# Patient Record
Sex: Male | Born: 1945
Health system: Southern US, Community
[De-identification: ages and names within clinical notes are randomized; demographics above are authoritative.]

## PROBLEM LIST (undated history)

## (undated) DIAGNOSIS — A048 Other specified bacterial intestinal infections: Secondary | ICD-10-CM

## (undated) DIAGNOSIS — I959 Hypotension, unspecified: Secondary | ICD-10-CM

## (undated) DIAGNOSIS — IMO0001 Reserved for inherently not codable concepts without codable children: Secondary | ICD-10-CM

## (undated) DIAGNOSIS — K219 Gastro-esophageal reflux disease without esophagitis: Secondary | ICD-10-CM

## (undated) HISTORY — PX: NO PAST SURGERIES: SHX2092

## (undated) HISTORY — DX: Other specified bacterial intestinal infections: A04.8

## (undated) HISTORY — DX: Gastro-esophageal reflux disease without esophagitis: K21.9

## (undated) HISTORY — DX: Hypotension, unspecified: I95.9

## (undated) HISTORY — DX: Reserved for inherently not codable concepts without codable children: IMO0001

---

## 2012-12-02 ENCOUNTER — Ambulatory Visit (INDEPENDENT_AMBULATORY_CARE_PROVIDER_SITE_OTHER): Payer: BC Managed Care – PPO | Admitting: Internal Medicine

## 2012-12-02 ENCOUNTER — Encounter: Payer: Self-pay | Admitting: Internal Medicine

## 2012-12-02 VITALS — BP 132/80 | HR 76 | Temp 97.8°F | Ht 67.0 in | Wt 136.0 lb

## 2012-12-02 DIAGNOSIS — B0229 Other postherpetic nervous system involvement: Secondary | ICD-10-CM

## 2012-12-02 DIAGNOSIS — Z0000 Encounter for general adult medical examination without abnormal findings: Secondary | ICD-10-CM

## 2012-12-02 LAB — POCT URINALYSIS DIPSTICK
Ketones, UA: NEGATIVE
Protein, UA: NEGATIVE
Spec Grav, UA: 1.015
pH, UA: 6

## 2012-12-02 LAB — COMPREHENSIVE METABOLIC PANEL
AST: 23 U/L (ref 0–37)
Alkaline Phosphatase: 53 U/L (ref 39–117)
BUN: 12 mg/dL (ref 6–23)
Calcium: 9 mg/dL (ref 8.4–10.5)
Chloride: 106 mEq/L (ref 96–112)
Creat: 0.84 mg/dL (ref 0.50–1.35)

## 2012-12-03 LAB — CBC WITH DIFFERENTIAL/PLATELET
Basophils Absolute: 0 10*3/uL (ref 0.0–0.1)
Basophils Relative: 0 % (ref 0–1)
Eosinophils Relative: 1 % (ref 0–5)
HCT: 38.7 % — ABNORMAL LOW (ref 39.0–52.0)
MCHC: 34.4 g/dL (ref 30.0–36.0)
MCV: 89.8 fL (ref 78.0–100.0)
Monocytes Absolute: 0.5 10*3/uL (ref 0.1–1.0)
RDW: 14.8 % (ref 11.5–15.5)

## 2012-12-03 LAB — HEPATITIS C ANTIBODY: HCV Ab: NEGATIVE

## 2012-12-03 LAB — HEPATITIS B SURFACE ANTIBODY, QUANTITATIVE: Hepatitis B-Post: 936.8 m[IU]/mL

## 2012-12-18 ENCOUNTER — Encounter: Payer: Self-pay | Admitting: Internal Medicine

## 2012-12-18 NOTE — Progress Notes (Signed)
  Subjective:    Patient ID: Preston Mcdaniel, male    DOB: 1946-04-09, 67 y.o.   MRN: [id number]  HPI Mr. Hoffer is a 67 year old native of [place] who was  Diagnosed 11/13/12 with acute Herpes zoster affecting his right trunk. He was placed on Valtrex one gram tid for 7 days, Sterapred   10 mg 6 day dose pack and Hydrocodone APAP. He has improved but having some post herpetic neuralgia type pain along right trunk keeping him from getting a good night's sleep.         Fhx: Mother died when pt was 39 years old during childbirth. Father died in 73 at age 39 reportedly of old age- pt cannot be more specific.  Social history: nonsmoker; does not drink alcohol. Budhist faith; married one daughter and one son. Daughter operates [employer] on [address]. Pt does not speak [place]. General health has been excellent with NKDA,  No serious illnesses, hospitalizations or accidents Works in [employer] in [place].     Review of Systems  Constitutional: Negative.   HENT: Negative.   Eyes: Negative.   Respiratory: Negative.   Cardiovascular: Negative.   Gastrointestinal: Negative.   Endocrine: Negative.   Genitourinary: Negative.   Musculoskeletal: Negative.   Allergic/Immunologic: Negative.   Neurological:       Right trunk pain S/p shingles  Hematological: Negative.   Psychiatric/Behavioral: Negative.        Objective:   Physical Exam  Vitals reviewed. Constitutional: He is oriented to person, place, and time. He appears well-developed and well-nourished. No distress.  HENT:  Head: Normocephalic and atraumatic.  Right Ear: External ear normal.  Left Ear: External ear normal.  Mouth/Throat: Oropharynx is clear and moist. No oropharyngeal exudate.  Eyes: Conjunctivae and EOM are normal. Pupils are equal, round, and reactive to light. Right eye exhibits no discharge. Left eye exhibits no discharge. No scleral icterus.  Neck: Neck supple. No JVD present. No thyromegaly  present.  Cardiovascular: Normal rate, regular rhythm, normal heart sounds and intact distal pulses.   No murmur heard. Pulmonary/Chest: Effort normal and breath sounds normal. No respiratory distress. He has no wheezes. He has no rales. He exhibits no tenderness.  Abdominal: Soft. Bowel sounds are normal. He exhibits no distension and no mass. There is no tenderness. There is no rebound and no guarding.  Genitourinary: Prostate normal.  Musculoskeletal: Normal range of motion. He exhibits no edema.  Lymphadenopathy:    He has no cervical adenopathy.  Neurological: He is alert and oriented to person, place, and time. He has normal reflexes. No cranial nerve deficit. Coordination normal.  Skin: Skin is warm and dry. He is not diaphoretic.  Resolving zoster rash right trunk  Psychiatric: He has a normal mood and affect. His behavior is normal. Judgment and thought content normal.          Assessment & Plan:  S/p Herpes zoster Jan 2014 Post Herpetic neuralgia Plan Gabapentin 300 mg bid or tid for pain- start with one dose daily and work up to tid. Call if symptoms persist.  Consider colonoscopy. Check for Hep B and Hep C. Need pneumovax at age 67

## 2012-12-18 NOTE — Patient Instructions (Addendum)
Take Gabapentin as directed for neuralgia. Call if symtoms persist.

## 2013-06-02 ENCOUNTER — Ambulatory Visit (INDEPENDENT_AMBULATORY_CARE_PROVIDER_SITE_OTHER): Payer: BC Managed Care – PPO | Admitting: Internal Medicine

## 2013-06-02 ENCOUNTER — Encounter: Payer: Self-pay | Admitting: Internal Medicine

## 2013-06-02 VITALS — BP 116/74 | HR 72 | Temp 97.8°F | Wt 135.0 lb

## 2013-06-02 DIAGNOSIS — Z1211 Encounter for screening for malignant neoplasm of colon: Secondary | ICD-10-CM

## 2013-06-02 DIAGNOSIS — I889 Nonspecific lymphadenitis, unspecified: Secondary | ICD-10-CM

## 2013-06-02 DIAGNOSIS — J069 Acute upper respiratory infection, unspecified: Secondary | ICD-10-CM

## 2013-06-02 DIAGNOSIS — Z8619 Personal history of other infectious and parasitic diseases: Secondary | ICD-10-CM

## 2013-06-02 NOTE — Progress Notes (Signed)
  Subjective:    Patient ID: Preston Mcdaniel, male    DOB: 27-Dec-1945, 67 y.o.   MRN: [id number]  HPI Patient in today for six-month recheck. In February, he had a severe case of shingles. He now wants to get Zostavax vaccine. He also wants to have a colonoscopy. Has never had screening colonoscopy. Son-in-law has had a recent respiratory infection. Patient has had some tenderness in his right neck area but denies sore throat or significant cough. No fever or shaking chills.    Review of Systems     Objective:   Physical Exam  Pharynx is clear without exudate. Dentition is fair. TMs are clear.neck is supple. Has small right anterior cervical node that is tender to palpation. Chest is clear to auscultation. Cardiac exam regular rate and rhythm normal S1 and S2.        Assessment & Plan:  Cervical adenitis  History of shingles  Plan: Prescription given to get Zostavax vaccine at pharmacy. Zithromax Z-PAK take 2 tablets day one followed by 1 tablet days 2 through 5 for cervical adenitis/URI. Return in 6 months for physical exam. Arrange for screening colonoscopy.

## 2013-06-02 NOTE — Patient Instructions (Addendum)
Prescription given for Zostavax vaccine to be obtained at pharmacy. Take Zithromax as directed for cervical adenitis and respiratory infection. Return in 6 months. We will arrange for colonoscopy

## 2013-06-05 NOTE — Addendum Note (Signed)
Addended by: Judy Pimple on: 06/05/2013 12:01 PM   Modules accepted: Orders

## 2013-06-05 NOTE — Addendum Note (Signed)
Addended by: Judy Pimple on: 06/05/2013 12:04 PM   Modules accepted: Orders

## 2013-07-24 ENCOUNTER — Encounter: Payer: Self-pay | Admitting: Gastroenterology

## 2013-12-08 ENCOUNTER — Other Ambulatory Visit: Payer: BC Managed Care – PPO | Admitting: Internal Medicine

## 2013-12-15 ENCOUNTER — Encounter: Payer: BC Managed Care – PPO | Admitting: Internal Medicine

## 2013-12-15 ENCOUNTER — Telehealth: Payer: Self-pay | Admitting: *Deleted

## 2013-12-15 NOTE — Telephone Encounter (Signed)
Attempted to call pt for Pre-Visit establish care call. Phone only rang, no voice mail set up.

## 2013-12-16 ENCOUNTER — Ambulatory Visit: Payer: BC Managed Care – PPO | Admitting: Family Medicine

## 2014-02-19 ENCOUNTER — Telehealth: Payer: Self-pay

## 2014-02-19 NOTE — Telephone Encounter (Signed)
Unable to reach patient pre visit. Invalid numbers listed.

## 2014-02-20 ENCOUNTER — Ambulatory Visit (INDEPENDENT_AMBULATORY_CARE_PROVIDER_SITE_OTHER): Payer: BC Managed Care – PPO | Admitting: Family Medicine

## 2014-02-20 ENCOUNTER — Encounter: Payer: Self-pay | Admitting: Family Medicine

## 2014-02-20 VITALS — BP 120/78 | HR 87 | Temp 98.6°F | Ht 67.0 in | Wt 137.0 lb

## 2014-02-20 DIAGNOSIS — H538 Other visual disturbances: Secondary | ICD-10-CM

## 2014-02-20 DIAGNOSIS — Z0000 Encounter for general adult medical examination without abnormal findings: Secondary | ICD-10-CM

## 2014-02-20 DIAGNOSIS — Z23 Encounter for immunization: Secondary | ICD-10-CM

## 2014-02-20 LAB — CBC WITH DIFFERENTIAL/PLATELET
BASOS ABS: 0 10*3/uL (ref 0.0–0.1)
Basophils Relative: 0.2 % (ref 0.0–3.0)
Eosinophils Absolute: 0 10*3/uL (ref 0.0–0.7)
Eosinophils Relative: 0.4 % (ref 0.0–5.0)
HEMATOCRIT: 46.5 % (ref 39.0–52.0)
Hemoglobin: 15.5 g/dL (ref 13.0–17.0)
LYMPHS ABS: 2.5 10*3/uL (ref 0.7–4.0)
Lymphocytes Relative: 28.4 % (ref 12.0–46.0)
MCHC: 33.4 g/dL (ref 30.0–36.0)
MCV: 93.4 fl (ref 78.0–100.0)
MONO ABS: 0.3 10*3/uL (ref 0.1–1.0)
Monocytes Relative: 3.6 % (ref 3.0–12.0)
NEUTROS PCT: 67.4 % (ref 43.0–77.0)
Neutro Abs: 5.9 10*3/uL (ref 1.4–7.7)
PLATELETS: 178 10*3/uL (ref 150.0–400.0)
RBC: 4.98 Mil/uL (ref 4.22–5.81)
RDW: 14.4 % (ref 11.5–14.6)
WBC: 8.7 10*3/uL (ref 4.5–10.5)

## 2014-02-20 LAB — POCT URINALYSIS DIPSTICK
Bilirubin, UA: NEGATIVE
Blood, UA: NEGATIVE
Glucose, UA: NEGATIVE
KETONES UA: NEGATIVE
Leukocytes, UA: NEGATIVE
Nitrite, UA: NEGATIVE
Protein, UA: NEGATIVE
Spec Grav, UA: 1.01
Urobilinogen, UA: 0.2
pH, UA: 7.5

## 2014-02-20 LAB — BASIC METABOLIC PANEL
BUN: 15 mg/dL (ref 6–23)
CHLORIDE: 102 meq/L (ref 96–112)
CO2: 26 mEq/L (ref 19–32)
CREATININE: 0.9 mg/dL (ref 0.4–1.5)
Calcium: 9.6 mg/dL (ref 8.4–10.5)
GFR: 94.76 mL/min (ref >60.00)
GLUCOSE: 98 mg/dL (ref 70–99)
POTASSIUM: 3.9 meq/L (ref 3.5–5.1)
Sodium: 138 mEq/L (ref 135–145)

## 2014-02-20 LAB — LIPID PANEL
CHOLESTEROL: 194 mg/dL (ref 0–200)
HDL: 53.8 mg/dL (ref >39.00)
LDL Cholesterol: 123 mg/dL — ABNORMAL HIGH (ref 0–99)
Total CHOL/HDL Ratio: 4
Triglycerides: 85 mg/dL (ref 0.0–149.0)
VLDL: 17 mg/dL (ref 0.0–40.0)

## 2014-02-20 LAB — HEPATIC FUNCTION PANEL
ALT: 21 U/L (ref 0–53)
AST: 22 U/L (ref 0–37)
Albumin: 4.6 g/dL (ref 3.5–5.2)
Alkaline Phosphatase: 61 U/L (ref 39–117)
BILIRUBIN DIRECT: 0.1 mg/dL (ref 0.0–0.3)
BILIRUBIN TOTAL: 1.1 mg/dL (ref 0.3–1.2)
Total Protein: 7.6 g/dL (ref 6.0–8.3)

## 2014-02-20 LAB — PSA: PSA: 0.64 ng/mL (ref 0.10–4.00)

## 2014-02-20 LAB — TSH: TSH: 1.89 u[IU]/mL (ref 0.35–5.50)

## 2014-02-20 NOTE — Progress Notes (Signed)
Pre visit review using our clinic review tool, if applicable. No additional management support is needed unless otherwise documented below in the visit note. 

## 2014-02-20 NOTE — Progress Notes (Signed)
   Subjective:    Patient ID: Preston Mcdaniel, male    DOB: 1946-06-14, 68 y.o.   MRN: [id number]  HPI Pt here for cpe and to establish.  Pt complains of blurry vision and floaters.  No other complaints.   History reviewed. No pertinent past medical history. History  Substance Use Topics  . Smoking status: Never Smoker   . Smokeless tobacco: Never Used  . Alcohol Use: No   History reviewed. No pertinent family history. No current outpatient prescriptions on file.   No current facility-administered medications for this visit.   No Known Allergies  Review of Systems  Constitutional: Negative.   HENT: Negative for congestion, ear pain, hearing loss, nosebleeds, postnasal drip, rhinorrhea, sinus pressure, sneezing and tinnitus.   Eyes: Positive for visual disturbance. Negative for photophobia, discharge and itching.       Blurry vision and sees floaters  Respiratory: Negative.   Cardiovascular: Negative.   Gastrointestinal: Negative for abdominal pain, constipation, blood in stool, abdominal distention and anal bleeding.  Endocrine: Negative.   Genitourinary: Negative.   Musculoskeletal: Negative.   Skin: Negative.   Allergic/Immunologic: Negative.   Neurological: Negative for dizziness, weakness, light-headedness, numbness and headaches.  Psychiatric/Behavioral: Negative for suicidal ideas, confusion, sleep disturbance, dysphoric mood, decreased concentration and agitation. The patient is not nervous/anxious.        Objective:   Physical Exam  Constitutional: He is oriented to person, place, and time. He appears well-developed and well-nourished. No distress.  HENT:  Head: Normocephalic and atraumatic.  Right Ear: External ear normal.  Left Ear: External ear normal.  Nose: Nose normal.  Mouth/Throat: Oropharynx is clear and moist. No oropharyngeal exudate.  Eyes: Conjunctivae and EOM are normal. Pupils are equal, round, and reactive to light. Right eye exhibits no discharge. Left  eye exhibits no discharge.  Neck: Normal range of motion. Neck supple. No JVD present. No thyromegaly present.  Cardiovascular: Normal rate, regular rhythm and intact distal pulses.  Exam reveals no gallop and no friction rub.   No murmur heard. Pulmonary/Chest: Effort normal and breath sounds normal. No respiratory distress. He has no wheezes. He has no rales. He exhibits no tenderness.  Abdominal: Soft. Bowel sounds are normal. He exhibits no distension and no mass. There is no tenderness. There is no rebound and no guarding.  Genitourinary: Rectum normal, prostate normal and penis normal. Guaiac negative stool.  Musculoskeletal: Normal range of motion. He exhibits no edema and no tenderness.  Lymphadenopathy:    He has no cervical adenopathy.  Neurological: He is alert and oriented to person, place, and time. He displays normal reflexes. He exhibits normal muscle tone.  Skin: Skin is warm and dry. No rash noted. He is not diaphoretic. No erythema. No pallor.  Psychiatric: He has a normal mood and affect. His behavior is normal. Judgment and thought content normal.          Assessment & Plan:  1. Blurry vision  - Ambulatory referral to Ophthalmology  2. Preventative health care See avs Check labs Colonoscopy refused Check ifob - Basic metabolic panel - CBC with Differential - Hepatic function panel - Lipid panel - POCT urinalysis dipstick - PSA - TSH - Varicella zoster antibody, IgG - Fecal occult blood, imunochemical; Future

## 2014-02-20 NOTE — Patient Instructions (Signed)
Preventive Care for Adults, Male A healthy lifestyle and preventive care can promote health and wellness. Preventive health guidelines for men include the following key practices:  A routine yearly physical is a good way to check with your health care provider about your health and preventative screening. It is a chance to share any concerns and updates on your health and to receive a thorough exam.  Visit your dentist for a routine exam and preventative care every 6 months. Brush your teeth twice a day and floss once a day. Good oral hygiene prevents tooth decay and gum disease.  The frequency of eye exams is based on your age, health, family medical history, use of contact lenses, and other factors. Follow your health care provider's recommendations for frequency of eye exams.  Eat a healthy diet. Foods such as vegetables, fruits, whole grains, low-fat dairy products, and lean protein foods contain the nutrients you need without too many calories. Decrease your intake of foods high in solid fats, added sugars, and salt. Eat the right amount of calories for you.Get information about a proper diet from your health care provider, if necessary.  Regular physical exercise is one of the most important things you can do for your health. Most adults should get at least 150 minutes of moderate-intensity exercise (any activity that increases your heart rate and causes you to sweat) each week. In addition, most adults need muscle-strengthening exercises on 2 or more days a week.  Maintain a healthy weight. The body mass index (BMI) is a screening tool to identify possible weight problems. It provides an estimate of body fat based on height and weight. Your health care provider can find your BMI and can help you achieve or maintain a healthy weight.For adults 20 years and older:  A BMI below 18.5 is considered underweight.  A BMI of 18.5 to 24.9 is normal.  A BMI of 25 to 29.9 is considered  overweight.  A BMI of 30 and above is considered obese.  Maintain normal blood lipids and cholesterol levels by exercising and minimizing your intake of saturated fat. Eat a balanced diet with plenty of fruit and vegetables. Blood tests for lipids and cholesterol should begin at age 42 and be repeated every 5 years. If your lipid or cholesterol levels are high, you are over 50, or you are at high risk for heart disease, you may need your cholesterol levels checked more frequently.Ongoing high lipid and cholesterol levels should be treated with medicines if diet and exercise are not working.  If you smoke, find out from your health care provider how to quit. If you do not use tobacco, do not start.  Lung cancer screening is recommended for adults aged 24 80 years who are at high risk for developing lung cancer because of a history of smoking. A yearly low-dose CT scan of the lungs is recommended for people who have at least a 30-pack-year history of smoking and are a current smoker or have quit within the past 15 years. A pack year of smoking is smoking an average of 1 pack of cigarettes a day for 1 year (for example: 1 pack a day for 30 years or 2 packs a day for 15 years). Yearly screening should continue until the smoker has stopped smoking for at least 15 years. Yearly screening should be stopped for people who develop a health problem that would prevent them from having lung cancer treatment.  If you choose to drink alcohol, do not have  more than 2 drinks per day. One drink is considered to be 12 ounces (355 mL) of beer, 5 ounces (148 mL) of wine, or 1.5 ounces (44 mL) of liquor.  Avoid use of street drugs. Do not share needles with anyone. Ask for help if you need support or instructions about stopping the use of drugs.  High blood pressure causes heart disease and increases the risk of stroke. Your blood pressure should be checked at least every 1 2 years. Ongoing high blood pressure should be  treated with medicines, if weight loss and exercise are not effective.  If you are 75 66 years old, ask your health care provider if you should take aspirin to prevent heart disease.  Diabetes screening involves taking a blood sample to check your fasting blood sugar level. This should be done once every 3 years, after age 19, if you are within normal weight and without risk factors for diabetes. Testing should be considered at a younger age or be carried out more frequently if you are overweight and have at least 1 risk factor for diabetes.  Colorectal cancer can be detected and often prevented. Most routine colorectal cancer screening begins at the age of 47 and continues through age 80. However, your health care provider may recommend screening at an earlier age if you have risk factors for colon cancer. On a yearly basis, your health care provider may provide home test kits to check for hidden blood in the stool. Use of a small camera at the end of a tube to directly examine the colon (sigmoidoscopy or colonoscopy) can detect the earliest forms of colorectal cancer. Talk to your health care provider about this at age 66, when routine screening begins. Direct exam of the colon should be repeated every 5 10 years through age 19, unless early forms of precancerous polyps or small growths are found.  People who are at an increased risk for hepatitis B should be screened for this virus. You are considered at high risk for hepatitis B if:  You were born in a country where hepatitis B occurs often. Talk with your health care provider about which countries are considered high-risk.  Your parents were born in a high-risk country and you have not received a shot to protect against hepatitis B (hepatitis B vaccine).  You have HIV or AIDS.  You use needles to inject street drugs.  You live with, or have sex with, someone who has hepatitis B.  You are a man who has sex with other men (MSM).  You get  hemodialysis treatment.  You take certain medicines for conditions such as cancer, organ transplantation, and autoimmune conditions.  Hepatitis C blood testing is recommended for all people born from [birth year] through 1965 and any individual with known risks for hepatitis C.  Practice safe sex. Use condoms and avoid high-risk sexual practices to reduce the spread of sexually transmitted infections (STIs). STIs include gonorrhea, chlamydia, syphilis, trichomonas, herpes, HPV, and human immunodeficiency virus (HIV). Herpes, HIV, and HPV are viral illnesses that have no cure. They can result in disability, cancer, and death.  A one-time screening for abdominal aortic aneurysm (AAA) and surgical repair of large AAAs by ultrasound are recommended for men ages 94 to 74 years who are current or former smokers.  Healthy men should no longer receive prostate-specific antigen (PSA) blood tests as part of routine cancer screening. Talk with your health care provider about prostate cancer screening.  Testicular cancer screening is not recommended  for adult males who have no symptoms. Screening includes self-exam, a health care provider exam, and other screening tests. Consult with your health care provider about any symptoms you have or any concerns you have about testicular cancer.  Use sunscreen. Apply sunscreen liberally and repeatedly throughout the day. You should seek shade when your shadow is shorter than you. Protect yourself by wearing long sleeves, pants, a wide-brimmed hat, and sunglasses year round, whenever you are outdoors.  Once a month, do a whole-body skin exam, using a mirror to look at the skin on your back. Tell your health care provider about new moles, moles that have irregular borders, moles that are larger than a pencil eraser, or moles that have changed in shape or color.  Stay current with required vaccines (immunizations).  Influenza vaccine. All adults should be immunized every  year.  Tetanus, diphtheria, and acellular pertussis (Td, Tdap) vaccine. An adult who has not previously received Tdap or who does not know his vaccine status should receive 1 dose of Tdap. This initial dose should be followed by tetanus and diphtheria toxoids (Td) booster doses every 10 years. Adults with an unknown or incomplete history of completing a 3-dose immunization series with Td-containing vaccines should begin or complete a primary immunization series including a Tdap dose. Adults should receive a Td booster every 10 years.  Varicella vaccine. An adult without evidence of immunity to varicella should receive 2 doses or a second dose if he has previously received 1 dose.  Human papillomavirus (HPV) vaccine. Males aged 44 21 years who have not received the vaccine previously should receive the 3-dose series. Males aged 43 26 years may be immunized. Immunization is recommended through the age of 50 years for any male who has sex with males and did not get any or all doses earlier. Immunization is recommended for any person with an immunocompromised condition through the age of 23 years if he did not get any or all doses earlier. During the 3-dose series, the second dose should be obtained 4 8 weeks after the first dose. The third dose should be obtained 24 weeks after the first dose and 16 weeks after the second dose.  Zoster vaccine. One dose is recommended for adults aged 96 years or older unless certain conditions are present.  Measles, mumps, and rubella (MMR) vaccine. Adults born before 55 generally are considered immune to measles and mumps. Adults born in [birth year] or later should have 1 or more doses of MMR vaccine unless there is a contraindication to the vaccine or there is laboratory evidence of immunity to each of the three diseases. A routine second dose of MMR vaccine should be obtained at least 28 days after the first dose for students attending postsecondary schools, health care  workers, or international travelers. People who received inactivated measles vaccine or an unknown type of measles vaccine during 1963 1967 should receive 2 doses of MMR vaccine. People who received inactivated mumps vaccine or an unknown type of mumps vaccine before 1979 and are at high risk for mumps infection should consider immunization with 2 doses of MMR vaccine. Unvaccinated health care workers born before [birth year] who lack laboratory evidence of measles, mumps, or rubella immunity or laboratory confirmation of disease should consider measles and mumps immunization with 2 doses of MMR vaccine or rubella immunization with 1 dose of MMR vaccine.  Pneumococcal 13-valent conjugate (PCV13) vaccine. When indicated, a person who is uncertain of his immunization history and has no record of immunization  should receive the PCV13 vaccine. An adult aged 67 years or older who has certain medical conditions and has not been previously immunized should receive 1 dose of PCV13 vaccine. This PCV13 should be followed with a dose of pneumococcal polysaccharide (PPSV23) vaccine. The PPSV23 vaccine dose should be obtained at least 8 weeks after the dose of PCV13 vaccine. An adult aged 79 years or older who has certain medical conditions and previously received 1 or more doses of PPSV23 vaccine should receive 1 dose of PCV13. The PCV13 vaccine dose should be obtained 1 or more years after the last PPSV23 vaccine dose.  Pneumococcal polysaccharide (PPSV23) vaccine. When PCV13 is also indicated, PCV13 should be obtained first. All adults aged 74 years and older should be immunized. An adult younger than age 50 years who has certain medical conditions should be immunized. Any person who resides in a nursing home or long-term care facility should be immunized. An adult smoker should be immunized. People with an immunocompromised condition and certain other conditions should receive both PCV13 and PPSV23 vaccines. People with human  immunodeficiency virus (HIV) infection should be immunized as soon as possible after diagnosis. Immunization during chemotherapy or radiation therapy should be avoided. Routine use of PPSV23 vaccine is not recommended for American Indians, [place] Natives, or people younger than 65 years unless there are medical conditions that require PPSV23 vaccine. When indicated, people who have unknown immunization and have no record of immunization should receive PPSV23 vaccine. One-time revaccination 5 years after the first dose of PPSV23 is recommended for people aged 41 64 years who have chronic kidney failure, nephrotic syndrome, asplenia, or immunocompromised conditions. People who received 1 2 doses of PPSV23 before age 15 years should receive another dose of PPSV23 vaccine at age 48 years or later if at least 5 years have passed since the previous dose. Doses of PPSV23 are not needed for people immunized with PPSV23 at or after age 69 years.  Meningococcal vaccine. Adults with asplenia or persistent complement component deficiencies should receive 2 doses of quadrivalent meningococcal conjugate (MenACWY-D) vaccine. The doses should be obtained at least 2 months apart. Microbiologists working with certain meningococcal bacteria, [place] recruits, people at risk during an outbreak, and people who travel to or live in countries with a high rate of meningitis should be immunized. A first-year college student up through age 7 years who is living in a residence hall should receive a dose if he did not receive a dose on or after his 16th birthday. Adults who have certain high-risk conditions should receive one or more doses of vaccine.  Hepatitis A vaccine. Adults who wish to be protected from this disease, have certain high-risk conditions, work with hepatitis A-infected animals, work in hepatitis A research labs, or travel to or work in countries with a high rate of hepatitis A should be immunized. Adults who were  previously unvaccinated and who anticipate close contact with an international adoptee during the first 60 days after arrival in the [place] from a country with a high rate of hepatitis A should be immunized.  Hepatitis B vaccine. Adults who wish to be protected from this disease, have certain high-risk conditions, may be exposed to blood or other infectious body fluids, are household contacts or sex partners of hepatitis B positive people, are clients or workers in certain care facilities, or travel to or work in countries with a high rate of hepatitis B should be immunized.  Haemophilus influenzae type b (Hib) vaccine. A  previously unvaccinated person with asplenia or sickle cell disease or having a scheduled splenectomy should receive 1 dose of Hib vaccine. Regardless of previous immunization, a recipient of a hematopoietic stem cell transplant should receive a 3-dose series 6 12 months after his successful transplant. Hib vaccine is not recommended for adults with HIV infection. Preventive Service / Frequency Ages 62 to 3  Blood pressure check.** / Every 1 to 2 years.  Lipid and cholesterol check.** / Every 5 years beginning at age 43.  Hepatitis C blood test.** / For any individual with known risks for hepatitis C.  Skin self-exam. / Monthly.  Influenza vaccine. / Every year.  Tetanus, diphtheria, and acellular pertussis (Tdap, Td) vaccine.** / Consult your health care provider. 1 dose of Td every 10 years.  Varicella vaccine.** / Consult your health care provider.  HPV vaccine. / 3 doses over 6 months, if 48 or younger.  Measles, mumps, rubella (MMR) vaccine.** / You need at least 1 dose of MMR if you were born in 1957 or later. You may also need a second dose.  Pneumococcal 13-valent conjugate (PCV13) vaccine.** / Consult your health care provider.  Pneumococcal polysaccharide (PPSV23) vaccine.** / 1 to 2 doses if you smoke cigarettes or if you have certain  conditions.  Meningococcal vaccine.** / 1 dose if you are age 8 to 70 years and a Market researcher living in a residence hall, or have one of several medical conditions. You may also need additional booster doses.  Hepatitis A vaccine.** / Consult your health care provider.  Hepatitis B vaccine.** / Consult your health care provider.  Haemophilus influenzae type b (Hib) vaccine.** / Consult your health care provider. Ages 48 to 32  Blood pressure check.** / Every 1 to 2 years.  Lipid and cholesterol check.** / Every 5 years beginning at age 38.  Lung cancer screening. / Every year if you are aged 40 80 years and have a 30-pack-year history of smoking and currently smoke or have quit within the past 15 years. Yearly screening is stopped once you have quit smoking for at least 15 years or develop a health problem that would prevent you from having lung cancer treatment.  Fecal occult blood test (FOBT) of stool. / Every year beginning at age 4 and continuing until age 70. You may not have to do this test if you get a colonoscopy every 10 years.  Flexible sigmoidoscopy** or colonoscopy.** / Every 5 years for a flexible sigmoidoscopy or every 10 years for a colonoscopy beginning at age 76 and continuing until age 62.  Hepatitis C blood test.** / For all people born from [birth year] through 1965 and any individual with known risks for hepatitis C.  Skin self-exam. / Monthly.  Influenza vaccine. / Every year.  Tetanus, diphtheria, and acellular pertussis (Tdap/Td) vaccine.** / Consult your health care provider. 1 dose of Td every 10 years.  Varicella vaccine.** / Consult your health care provider.  Zoster vaccine.** / 1 dose for adults aged 60 years or older.  Measles, mumps, rubella (MMR) vaccine.** / You need at least 1 dose of MMR if you were born in 1957 or later. You may also need a second dose.  Pneumococcal 13-valent conjugate (PCV13) vaccine.** / Consult your health care  provider.  Pneumococcal polysaccharide (PPSV23) vaccine.** / 1 to 2 doses if you smoke cigarettes or if you have certain conditions.  Meningococcal vaccine.** / Consult your health care provider.  Hepatitis A vaccine.** / Consult your health care  provider.  Hepatitis B vaccine.** / Consult your health care provider.  Haemophilus influenzae type b (Hib) vaccine.** / Consult your health care provider. Ages 65 and over  Blood pressure check.** / Every 1 to 2 years.  Lipid and cholesterol check.**/ Every 5 years beginning at age 20.  Lung cancer screening. / Every year if you are aged 55 80 years and have a 30-pack-year history of smoking and currently smoke or have quit within the past 15 years. Yearly screening is stopped once you have quit smoking for at least 15 years or develop a health problem that would prevent you from having lung cancer treatment.  Fecal occult blood test (FOBT) of stool. / Every year beginning at age 50 and continuing until age 75. You may not have to do this test if you get a colonoscopy every 10 years.  Flexible sigmoidoscopy** or colonoscopy.** / Every 5 years for a flexible sigmoidoscopy or every 10 years for a colonoscopy beginning at age 50 and continuing until age 75.  Hepatitis C blood test.** / For all people born from 1945 through 1965 and any individual with known risks for hepatitis C.  Abdominal aortic aneurysm (AAA) screening.** / A one-time screening for ages 65 to 75 years who are current or former smokers.  Skin self-exam. / Monthly.  Influenza vaccine. / Every year.  Tetanus, diphtheria, and acellular pertussis (Tdap/Td) vaccine.** / 1 dose of Td every 10 years.  Varicella vaccine.** / Consult your health care provider.  Zoster vaccine.** / 1 dose for adults aged 60 years or older.  Pneumococcal 13-valent conjugate (PCV13) vaccine.** / Consult your health care provider.  Pneumococcal polysaccharide (PPSV23) vaccine.** / 1 dose for all  adults aged 65 years and older.  Meningococcal vaccine.** / Consult your health care provider.  Hepatitis A vaccine.** / Consult your health care provider.  Hepatitis B vaccine.** / Consult your health care provider.  Haemophilus influenzae type b (Hib) vaccine.** / Consult your health care provider. **Family history and personal history of risk and conditions may change your health care provider's recommendations. Document Released: 12/12/2001 Document Revised: 08/06/2013 Document Reviewed: 03/13/2011 [employer] Patient Information 2014 [employer].  

## 2014-02-21 LAB — VARICELLA ZOSTER ANTIBODY, IGG

## 2014-02-25 ENCOUNTER — Other Ambulatory Visit (INDEPENDENT_AMBULATORY_CARE_PROVIDER_SITE_OTHER): Payer: BC Managed Care – PPO

## 2014-02-25 DIAGNOSIS — Z0000 Encounter for general adult medical examination without abnormal findings: Secondary | ICD-10-CM

## 2014-02-25 LAB — FECAL OCCULT BLOOD, IMMUNOCHEMICAL: Fecal Occult Bld: NEGATIVE

## 2014-03-20 ENCOUNTER — Ambulatory Visit (INDEPENDENT_AMBULATORY_CARE_PROVIDER_SITE_OTHER): Payer: BC Managed Care – PPO | Admitting: *Deleted

## 2014-03-20 DIAGNOSIS — Z23 Encounter for immunization: Secondary | ICD-10-CM

## 2014-06-12 ENCOUNTER — Encounter: Payer: Self-pay | Admitting: Family Medicine

## 2014-06-12 ENCOUNTER — Ambulatory Visit (INDEPENDENT_AMBULATORY_CARE_PROVIDER_SITE_OTHER): Payer: BC Managed Care – PPO | Admitting: Family Medicine

## 2014-06-12 VITALS — BP 150/90 | HR 81 | Temp 97.4°F | Wt 138.4 lb

## 2014-06-12 DIAGNOSIS — T148XXA Other injury of unspecified body region, initial encounter: Secondary | ICD-10-CM

## 2014-06-12 DIAGNOSIS — I10 Essential (primary) hypertension: Secondary | ICD-10-CM

## 2014-06-12 DIAGNOSIS — R55 Syncope and collapse: Secondary | ICD-10-CM

## 2014-06-12 DIAGNOSIS — I4510 Unspecified right bundle-branch block: Secondary | ICD-10-CM

## 2014-06-12 DIAGNOSIS — R0602 Shortness of breath: Secondary | ICD-10-CM

## 2014-06-12 LAB — POCT URINALYSIS DIPSTICK
Bilirubin, UA: NEGATIVE
Blood, UA: NEGATIVE
Glucose, UA: NEGATIVE
Ketones, UA: NEGATIVE
LEUKOCYTES UA: NEGATIVE
NITRITE UA: NEGATIVE
PH UA: 8
Protein, UA: NEGATIVE
Spec Grav, UA: <=1.005
UROBILINOGEN UA: 0.2

## 2014-06-12 LAB — BASIC METABOLIC PANEL
BUN: 11 mg/dL (ref 6–23)
CHLORIDE: 101 meq/L (ref 96–112)
CO2: 29 meq/L (ref 19–32)
Calcium: 9.8 mg/dL (ref 8.4–10.5)
Creatinine, Ser: 0.9 mg/dL (ref 0.4–1.5)
GFR: 85.43 mL/min (ref >60.00)
GLUCOSE: 101 mg/dL — AB (ref 70–99)
POTASSIUM: 3.6 meq/L (ref 3.5–5.1)
Sodium: 135 mEq/L (ref 135–145)

## 2014-06-12 LAB — CBC WITH DIFFERENTIAL/PLATELET
BASOS PCT: 0.1 % (ref 0.0–3.0)
Basophils Absolute: 0 10*3/uL (ref 0.0–0.1)
EOS PCT: 0.4 % (ref 0.0–5.0)
Eosinophils Absolute: 0 10*3/uL (ref 0.0–0.7)
HCT: 45.1 % (ref 39.0–52.0)
Hemoglobin: 15.3 g/dL (ref 13.0–17.0)
Lymphocytes Relative: 32.1 % (ref 12.0–46.0)
Lymphs Abs: 2.5 10*3/uL (ref 0.7–4.0)
MCHC: 33.8 g/dL (ref 30.0–36.0)
MCV: 93 fl (ref 78.0–100.0)
MONOS PCT: 4.6 % (ref 3.0–12.0)
Monocytes Absolute: 0.4 10*3/uL (ref 0.1–1.0)
NEUTROS PCT: 62.8 % (ref 43.0–77.0)
Neutro Abs: 5 10*3/uL (ref 1.4–7.7)
Platelets: 174 10*3/uL (ref 150.0–400.0)
RBC: 4.85 Mil/uL (ref 4.22–5.81)
RDW: 14.1 % (ref 11.5–15.5)
WBC: 7.9 10*3/uL (ref 4.0–10.5)

## 2014-06-12 LAB — HEPATIC FUNCTION PANEL
ALBUMIN: 4.4 g/dL (ref 3.5–5.2)
ALT: 19 U/L (ref 0–53)
AST: 24 U/L (ref 0–37)
Alkaline Phosphatase: 60 U/L (ref 39–117)
Bilirubin, Direct: 0 mg/dL (ref 0.0–0.3)
TOTAL PROTEIN: 7.7 g/dL (ref 6.0–8.3)
Total Bilirubin: 0.5 mg/dL (ref 0.2–1.2)

## 2014-06-12 LAB — APTT: aPTT: 32.1 s (ref 23.4–32.7)

## 2014-06-12 LAB — PROTIME-INR
INR: 1 ratio (ref 0.8–1.0)
Prothrombin Time: 10.7 s (ref 9.6–13.1)

## 2014-06-12 MED ORDER — LISINOPRIL 10 MG PO TABS: 10.0000 mg | ORAL_TABLET | Freq: Every day | ORAL | Status: DC

## 2014-06-12 NOTE — Patient Instructions (Signed)
Ng?t (Syncope) Ng?t l m?t thu?t ng? y h?c ?? ch? ng?t ho?c b?t t?nh. ?i?u ny c ngh?a l qu v? m?t  th?c v ng xu?ng ??t. Ng??i ta th??ng b?t t?nh trong th?i gian d??i 5 pht. Qu v? c th? c m?t s? l?n co gi?t c? ln ??n 15 giy tr??c khi t?nh v tr? l?i bnh th??ng. Ng?t x?y ra th??ng xuyn h?n ? nh?ng ng??i cao tu?i, nh?ng n c th? x?y ra v?i b?t c? ai. Trong khi h?u h?t nguyn nhn ng?t l khng nguy hi?m, ng?t c th? l d?u hi?u c?a m?t v?n ?? s?c kh?e nghim tr?ng. ?i?u quan tr?ng l c?n ???c ch?m [place] y t?.  NGUYN NHN  Ng?t l do dng mu ln no b? gi?m ??t ng?t. Nguyn nhn c? th? th??ng khng ???c xc ??nh. Cc y?u t? c th? gy ra ng?t bao g?m:  Dng thu?c h? huy?t p.  Thay ??i t? th? ??t ng?t, ch?ng h?n nh? ??ng ln nhanh.  Dng nhi?u thu?c h?n k ??n.  ??ng ? m?t n?i qu lu.  B?nh ??ng kinh.  M?t n??c v ti?p xc qu nhi?u v?i nhi?t.  L??ng ???ng trong mu th?p (h? ???ng huy?t).  Ph?i r?n m?nh ?? ??i ti?n.  B?nh tim, nh?p tim khng ??u ho?c cc v?n ?? tu?n hon khc.  S? hi, ?au kh? v? tnh th?n, nhn th?y mu ho?c ?au r?t nhi?u. TRI?U CH?NG  Ngay tr??c khi ng?t, qu v? c th?:  C?m th?y chng m?t ho?c chong vng.  C?m th?y bu?n nn.  Nhn th?y ton mu tr?ng ho?c ton mu ?en trong t?m nhn c?a qu v?.  Da l?nh, ?m. CH?N ?ON  Chuyn gia ch?m [place] s?c kh?e s? h?i v? cc tri?u ch?ng, khm th?c th?, v lm ?i?n tm ?? (ECG) ?? ghi l?i ho?t ??ng ?i?n c?a tim qu v?. Chuyn gia ch?m [place] s?c kh?e c?ng c th? khm tim ho?c lm cc xt nghi?m mu khc ?? xc ??nh nguyn nhn gy ng?t, c th? bao g?m:  Siu m tim qua thnh ng?c (TTE). Trong siu m tim, sng m thanh ???c s? d?ng ?? ?nh gi mu l?u thng qua tim nh? th? no.  Siu m tim qua th?c qu?n (TEE).  Theo di tim. Bi?n php ny cho php chuyn gia ch?m [place] s?c kh?e theo di nh?p v nh?p ?i?u c?a tim theo th?i gian th?c.  My ?i?n tim. ?y l m?t thi?t b? c?m tay ghi l?i nh?p tim v c th? gip  ch?n ?on r?i lo?n nh?p tim. N cho php chuyn gia ch?m [place] s?c kh?e theo di ho?t ??ng c?a tim trong vi ngy n?u c?n.  Nghi?m php g?ng s?c b?ng cch t?p th? d?c ho?c b?ng cch s? d?ng thu?c lm cho tim ??p nhanh h?n. ?I?U TR?  H?u h?t cc tr??ng h?p khng c?n ?i?u tr?Marland Kitchen Ty thu?c vo nguyn nhn gy ng?t, chuyn gia ch?m [place] s?c kh?e c th? [place] qu v? thay ??i ho?c d?ng m?t s? cc lo?i thu?c c?a qu v?. H??NG D?N CH?M [place] T?I NH  Nh? ai ? ? l?i cng qu v? cho ??n khi qu v? c?m th?y ?n ??nh.  Khng li xe, s? d?ng my mc, ho?c ch?i th? thao cho ??n khi chuyn gia ch?m [place] s?c kh?e ni c th? th?c hi?n ???c ?i?u ?.  Tun th? m?i cu?c h?n khm l?i theo ch? d?n c?a chuyn gia ch?m [place] s?c kh?e.  N?m xu?ng  ngay l?p t?c n?u qu v? b?t ??u c?m th?y nh? qu v? c th? ng?t. Ht th? su v ??u. Ch? cho ??n khi t?t c? cc tri?u ch?ng qua ?i.  U?ng ?? n??c ?? gi? cho n??c ti?u trong ho?c vng nh?t.  N?u qu v? ?ang dng thu?c ?i?u tr? huy?t p ho?c thu?c ?i?u tr? b?nh tim, hy ??ng d?y t? t? v dnh vi pht ?? ng?i r?i sau ? ??ng ln. ?i?u ny c th? gi?m c?m gic chng m?t. NGAY L?P T?C ?I KHM N?U:   Qu v? b? ?au ??u nhi?u.  Qu v? b? ?au b?t th??ng ? ng?c, b?ng ho?c l?ng.  Qu v? b? ch?y mu t? mi?ng ho?c tr?c trng, ho?c ?i ngoi ra phn c mu ?en ho?c mu h?c n.  Qu v? c nh?p tim khng ??u ho?c r?t nhanh.  Qu v? b? ?au khi th?.  Qu v? b? ng?t l?p ?i l?p l?i ho?c co gi?t gi?ng nh? ??ng kinh trong lc ng?t.  Qu v? ng?t khi ng?i ho?c n?m xu?ng.  Qu v? b? l l?n.  Qu v? ?i b? kh kh?n.  Qu v? b? y?u nhi?u.  Qu v? c v?n ?? v? th? l?c. N?u qu v? ng?t, hy g?i cc d?ch v? c?p c?u ? ??a ph??ng c?a qu v? (911 ? [place] Khng t? li xe ??n b?nh vi?n.  ??M B?O QU V?:  Hi?u r cc h??ng d?n ny.  S? theo di tnh tr?ng c?a mnh.  S? yu c?u tr? gip ngay l?p t?c n?u qu v? c?m th?y khng kh?e ho?c th?y tr?m tr?ng h?n. Document Released: 04/16/2012 Document  Revised: 10/21/2013 [facility] LLCExit[facility] Patient Information 2015 [place] LakeExitCare, [place]LL[place]. This information is not intended to replace advice given to you by your health care provider. Make sure you discuss any questions you have with your health care provider.

## 2014-06-12 NOTE — Progress Notes (Signed)
Pre visit review using our clinic review tool, if applicable. No additional management support is needed unless otherwise documented below in the visit note. 

## 2014-06-12 NOTE — Progress Notes (Signed)
   Subjective:    Patient ID: Preston Mcdaniel, male    DOB: 1945/12/15, 68 y.o.   MRN: [id number]  HPI Pt here c/o near syncope on 8/3--- he did not completely black out but had sob with episode and could not move at the time.  With interpreter pt states co workers massaged arms and legs and he was able to get up.  It has not occurred since but bp has been high.  No chest pain.    Review of Systems As above    Past Medical History  Diagnosis Date  . Hypertension    History   Social History  . Marital Status: Married    Spouse Name: N/A    Number of Children: N/A  . Years of Education: N/A   Occupational History  . Not on file.   Social History Main Topics  . Smoking status: Never Smoker   . Smokeless tobacco: Never Used  . Alcohol Use: No  . Drug Use: No  . Sexual Activity: Not on file   Other Topics Concern  . Not on file   Social History Narrative  . No narrative on file   History reviewed. No pertinent family history. Current Outpatient Prescriptions  Medication Sig Dispense Refill  . lisinopril (PRINIVIL,ZESTRIL) 10 MG tablet Take 1 tablet (10 mg total) by mouth daily.  30 tablet  2   No current facility-administered medications for this visit.    Objective:   Physical Exam BP 150/90  Pulse 81  Temp(Src) 97.4 F (36.3 C) (Oral)  Wt 138 lb 6.4 oz (62.778 kg)  SpO2 96% General appearance: alert, cooperative, appears stated age and no distress Throat: lips, mucosa, and tongue normal; teeth and gums normal Neck: no adenopathy, no JVD, supple, symmetrical, trachea midline and thyroid not enlarged, symmetric, no tenderness/mass/nodules Lungs: clear to auscultation bilaterally Heart: S1, S2 normal Extremities: extremities normal, atraumatic, no cyanosis or edema       Assessment & Plan:  1. SOB (shortness of breath)with episode of syncope - EKG 12-Lead - 2D Echocardiogram without contrast; Future - CBC with Differential - Basic metabolic panel - Hepatic  function panel - POCT urinalysis dipstick - INR/PT - PTT  2. Syncope, unspecified syncope type Suspect arrythmia - US Carotid Duplex Bilateral; Future - 2D Echocardiogram without contrast; Future - CBC with Differential - Basic metabolic panel - Hepatic function panel - POCT urinalysis dipstick - INR/PT - PTT  3. Bruising Check labs - CBC with Differential - Basic metabolic panel - Hepatic function panel - POCT urinalysis dipstick - INR/PT - PTT  4. Essential hypertension Start meds rto 2 weeks - lisinopril (PRINIVIL,ZESTRIL) 10 MG tablet; Take 1 tablet (10 mg total) by mouth daily.  Dispense: 30 tablet; Refill: 2

## 2014-06-17 ENCOUNTER — Ambulatory Visit (HOSPITAL_BASED_OUTPATIENT_CLINIC_OR_DEPARTMENT_OTHER)
Admission: RE | Admit: 2014-06-17 | Discharge: 2014-06-17 | Disposition: A | Discharge status: Home / Self Care | Payer: BC Managed Care – PPO | Source: Ambulatory Visit | Attending: Family Medicine | Admitting: Family Medicine

## 2014-06-17 DIAGNOSIS — R55 Syncope and collapse: Secondary | ICD-10-CM

## 2014-06-17 DIAGNOSIS — I359 Nonrheumatic aortic valve disorder, unspecified: Secondary | ICD-10-CM

## 2014-06-17 DIAGNOSIS — R0602 Shortness of breath: Secondary | ICD-10-CM

## 2014-06-17 NOTE — Progress Notes (Signed)
  Echocardiogram 2D Echocardiogram has been performed.  Preston Mcdaniel 06/17/2014, 11:40 AM

## 2014-07-08 ENCOUNTER — Other Ambulatory Visit: Payer: Self-pay

## 2014-07-08 DIAGNOSIS — Z87898 Personal history of other specified conditions: Secondary | ICD-10-CM

## 2014-07-08 DIAGNOSIS — I5189 Other ill-defined heart diseases: Secondary | ICD-10-CM

## 2014-08-05 ENCOUNTER — Telehealth: Payer: Self-pay | Admitting: Cardiology

## 2014-08-05 ENCOUNTER — Encounter: Payer: Self-pay | Admitting: Cardiology

## 2014-08-05 ENCOUNTER — Ambulatory Visit (INDEPENDENT_AMBULATORY_CARE_PROVIDER_SITE_OTHER): Payer: BC Managed Care – PPO | Admitting: Cardiology

## 2014-08-05 VITALS — BP 146/72 | HR 85 | Ht 67.0 in | Wt 134.8 lb

## 2014-08-05 DIAGNOSIS — I10 Essential (primary) hypertension: Secondary | ICD-10-CM

## 2014-08-05 DIAGNOSIS — R55 Syncope and collapse: Secondary | ICD-10-CM

## 2014-08-05 NOTE — Progress Notes (Signed)
     HPI: 68 year old male for evaluation of near syncope. Echocardiogram August 2015 showed normal LV function, grade 1 diastolic dysfunction, mild aortic and mitral regurgitation. Carotid Dopplers in August 2015 showed less than 50% left internal carotid artery stenosis. Laboratories August 2015 showed normal hemoglobin, potassium and liver functions. Note patient is from [place] and does not speak [place]. History is obtained with the assistance of an interpreter. Patient typically does not have dyspnea on exertion, orthopnea, PND, pedal edema, palpitations or chest pain. On August 1 he was going to work and developed "weakness". He was sent home. On arrival he had numbness in his arms and legs and his family he was massaging his extremities. They gave him ginger water to drink. He then developed nausea with vomiting and diaphoresis. There was associated palpitations. No chest pain or dyspnea. His symptoms then improved. He has felt as if his health is not quite as good since then. No definitive complaints.  No current outpatient prescriptions on file.   No current facility-administered medications for this visit.    No Known Allergies  Past Medical History  Diagnosis Date  . No cardiac disease     Past Surgical History  Procedure Laterality Date  . No past surgeries      History   Social History  . Marital Status: Married    Spouse Name: N/A    Number of Children: 2  . Years of Education: N/A   Occupational History  .      Restaurant   Social History Main Topics  . Smoking status: Never Smoker   . Smokeless tobacco: Never Used  . Alcohol Use: No  . Drug Use: No  . Sexual Activity: Not on file   Other Topics Concern  . Not on file   Social History Narrative  . No narrative on file    History reviewed. No pertinent family history.  ROS: no fevers or chills, productive cough, hemoptysis, dysphasia, odynophagia, melena, hematochezia, dysuria, hematuria, rash, seizure  activity, orthopnea, PND, pedal edema, claudication. Remaining systems are negative.  Physical Exam:   Blood pressure 146/72, pulse 85, height 5\' 7"  (1.702 m), weight 134 lb 12.8 oz (61.145 kg), SpO2 99.00%.  General:  Well developed/well nourished in NAD Skin warm/dry Patient not depressed No peripheral clubbing Back-normal HEENT-normal/normal eyelids Neck supple/normal carotid upstroke bilaterally; no bruits; no JVD; no thyromegaly chest - CTA/ normal expansion CV - RRR/normal S1 and S2; no murmurs, rubs or gallops;  PMI nondisplaced Abdomen -NT/ND, no HSM, no mass, + bowel sounds, no bruit 2+ femoral pulses, no bruits Ext-no edema, chords, 2+ DP Neuro-grossly nonfocal  ECG 06/12/2014-sinus rhythm, right bundle branch block.

## 2014-08-05 NOTE — Telephone Encounter (Signed)
Calling because he has decided to do the procedure that was discussed with Dr.Crenshaw .Marland Kitchen. Please call .Marland Kitchen.may need a translator  Thanks

## 2014-08-05 NOTE — Patient Instructions (Signed)
Your physician wants you to follow-up in: 6 MOINTHS WITH DR Jens SomRENSHAW You will receive a reminder letter in the mail two months in advance. If you don't receive a letter, please call our office to schedule the follow-up appointment.

## 2014-08-05 NOTE — Assessment & Plan Note (Signed)
Etiology of events unclear. Language barrier makes history difficult. He did not lose frank consciousness. His LV function is normal. His electrocardiogram shows sinus rhythm with right bundle branch block. I am not convinced it is consistent with Brugada. I have scheduled him for a monitor to look for significant arrhythmias.

## 2014-08-05 NOTE — Assessment & Plan Note (Signed)
Blood pressure mildly elevated. Further management per primary care. He may require antihypertensive in the future.

## 2014-08-06 NOTE — Telephone Encounter (Signed)
Order for event monitor placed. Please call to schedule

## 2014-08-10 ENCOUNTER — Telehealth: Payer: Self-pay

## 2014-08-10 NOTE — Telephone Encounter (Signed)
Needs appt in 2-3 months for BP check, mildly elevated at her cardiologists recently  ----- Message -----   From: Lewayne BuntingBrian S Crenshaw, MD  Sent: 08/05/2014 11:35 AM  To: Lelon PerlaYvonne R Lowne, DO   Pong [place] interpreter called the patient in a conference call and  Left a detailed message to have the patient come into the office to have a BP check with Dr.Lowne.     KP

## 2014-08-11 NOTE — Telephone Encounter (Signed)
We have made contact with the family and they are going to call to schedule.

## 2014-08-24 ENCOUNTER — Encounter (INDEPENDENT_AMBULATORY_CARE_PROVIDER_SITE_OTHER): Payer: BC Managed Care – PPO

## 2014-08-24 ENCOUNTER — Encounter: Payer: Self-pay | Admitting: *Deleted

## 2014-08-24 DIAGNOSIS — R55 Syncope and collapse: Secondary | ICD-10-CM

## 2014-08-24 NOTE — Progress Notes (Signed)
Patient ID: Preston AsaHa Mcdaniel, male   DOB: 1946-07-19, 68 y.o.   MRN: [id number] Preventice verite 30 day cardiac event monitor applied to patient.

## 2014-09-30 ENCOUNTER — Encounter: Payer: Self-pay | Admitting: *Deleted

## 2014-12-03 IMAGING — US US CAROTID DUPLEX BILAT
1 series · 13 of 24 positions shown · non-contrast
Comparison: None.

CLINICAL DATA: Syncopal episode.

EXAM:
BILATERAL CAROTID DUPLEX ULTRASOUND
TECHNIQUE: Gray scale imaging, color Doppler and duplex ultrasound were
performed of bilateral carotid and vertebral arteries in the neck.

[Series 1: us carotid duplex bilat · 0.07mm/px · 13 of 72 slices shown]
[im 1/72]
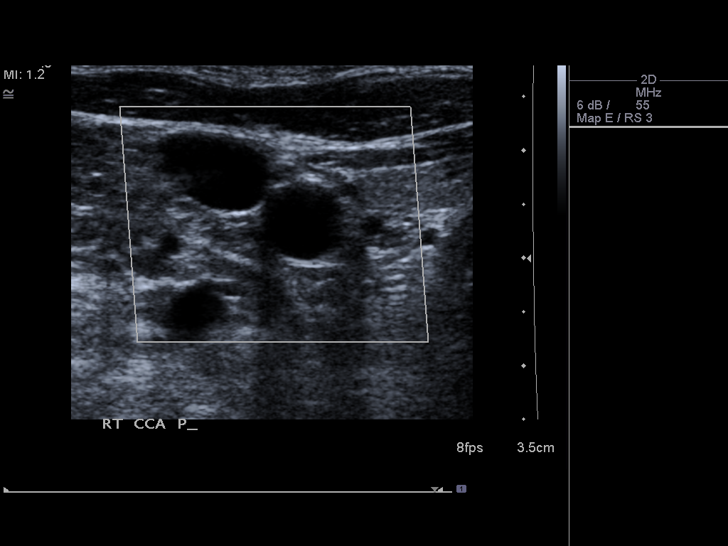
[im 7/72]
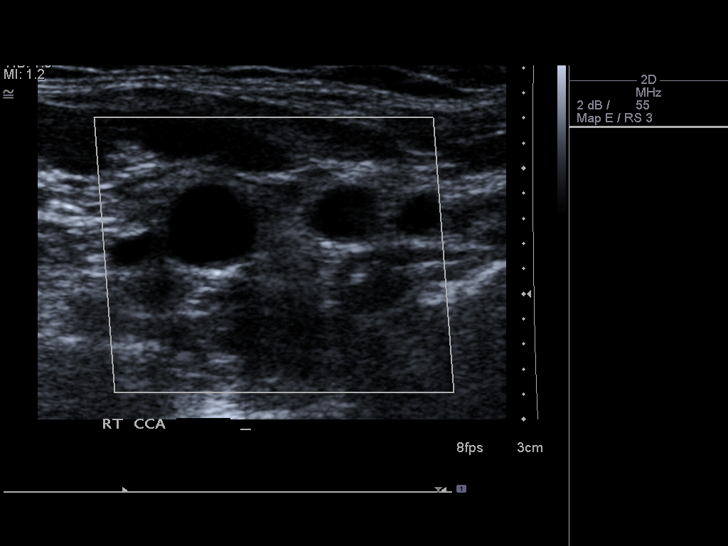
[im 13/72]
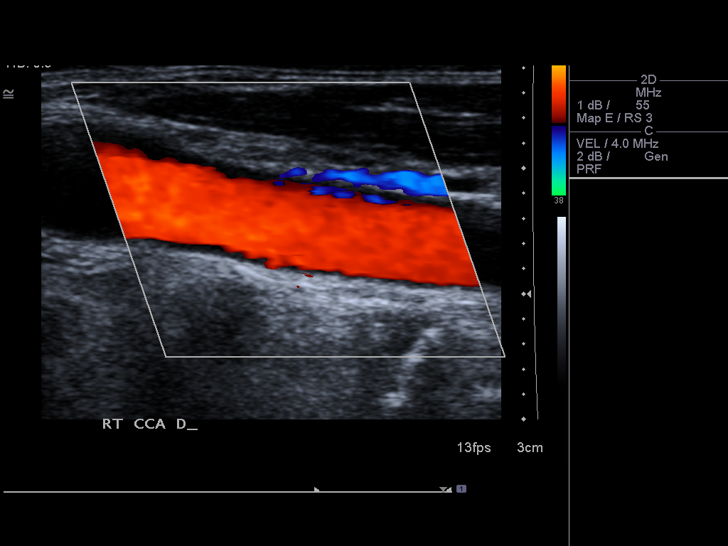
[im 19/72]
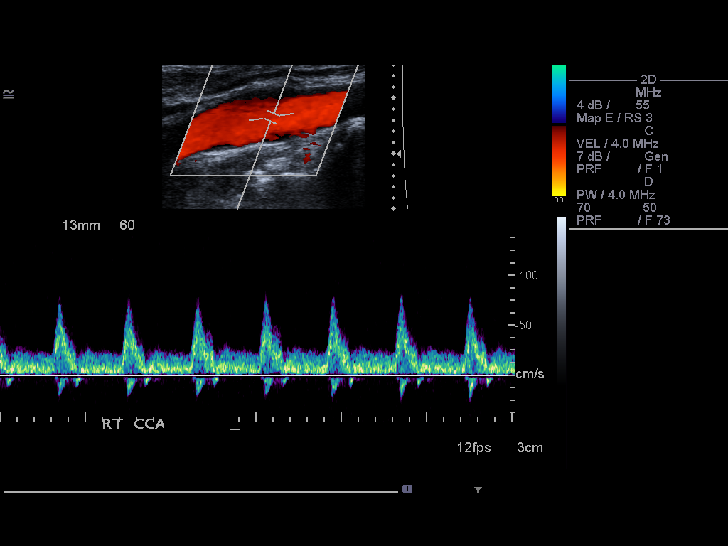
[im 25/72]
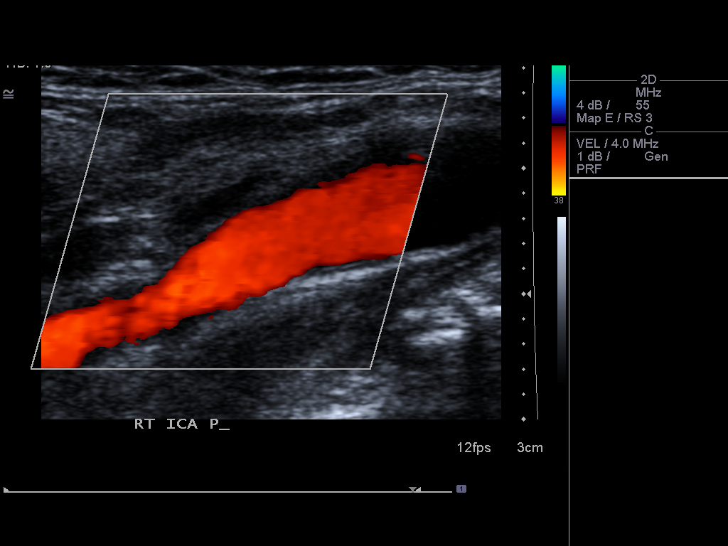
[im 31/72]
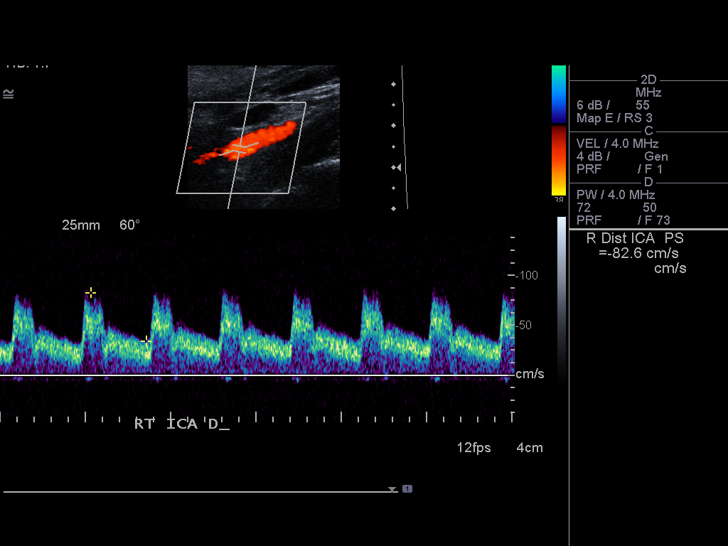
[im 38/72]
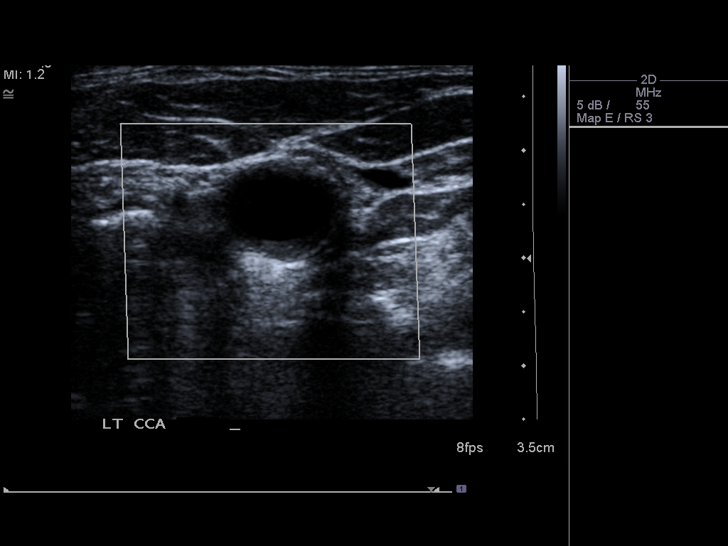
[im 41/72]
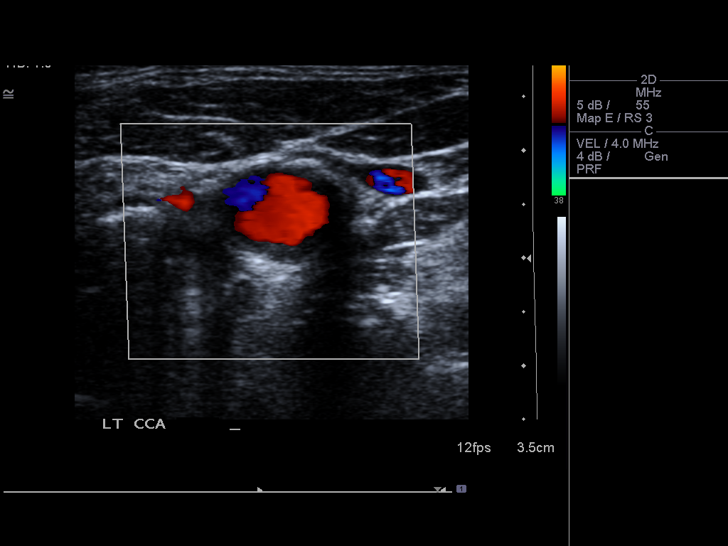
[im 47/72]
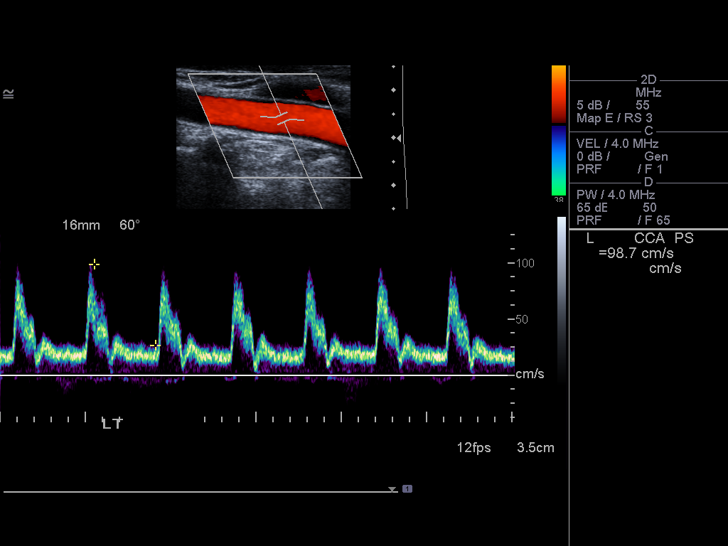
[im 53/72]
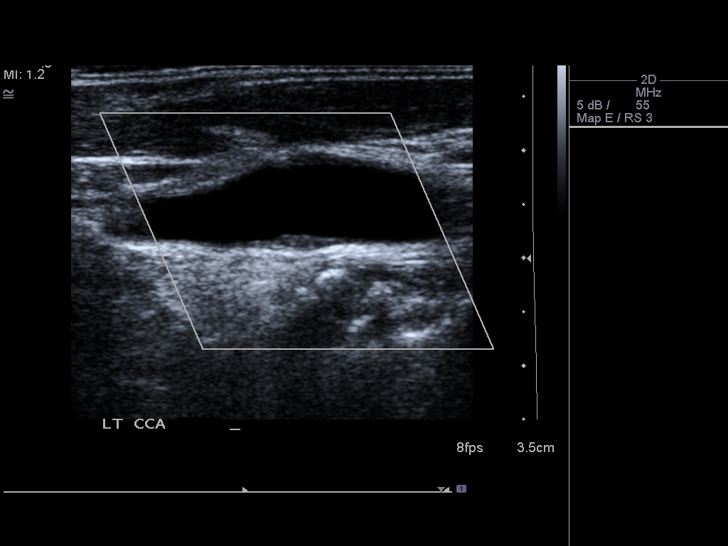
[im 59/72]
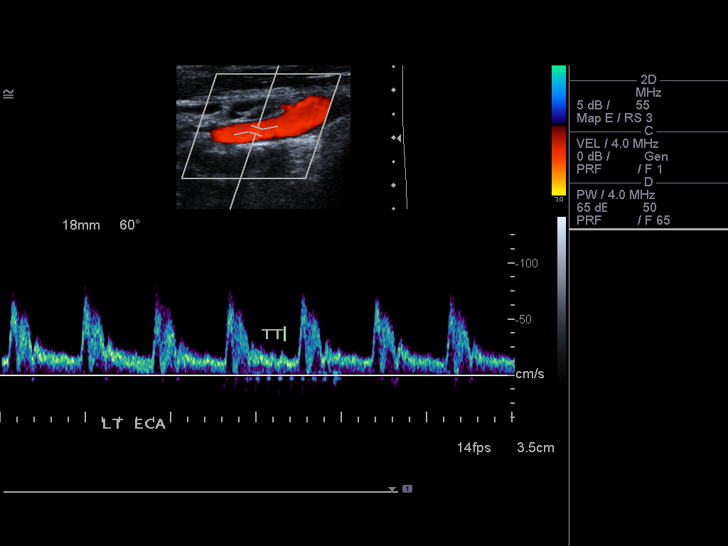
[im 65/72]
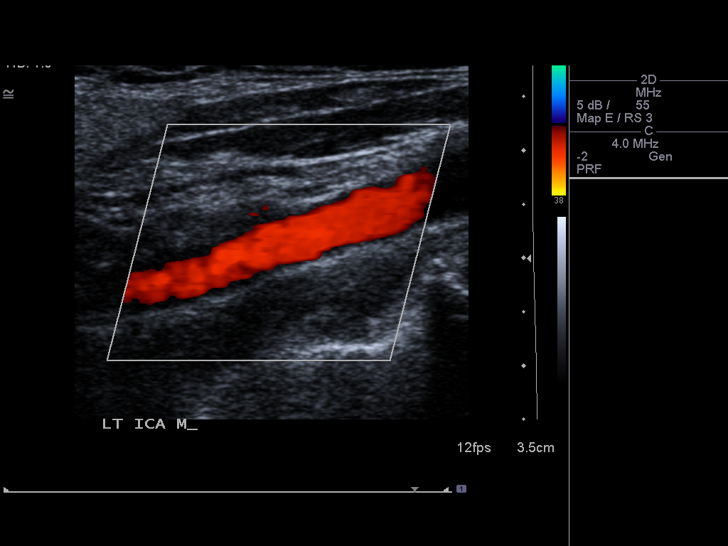
[im 72/72]
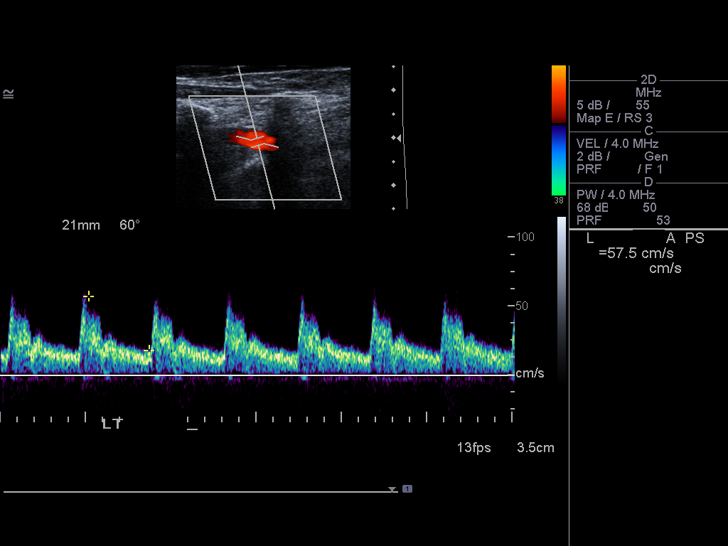

[13 of 24 positions shown; findings below may reference images not displayed]

FINDINGS: Criteria: Quantification of carotid stenosis is based on velocity
parameters that correlate the residual internal carotid diameter
with NASCET-based stenosis levels, using the diameter of the distal
internal carotid lumen as the denominator for stenosis measurement.

The following velocity measurements were obtained:

RIGHT

ICA:  90/32 cm/sec

CCA:  108/25 cm/sec

SYSTOLIC ICA/CCA RATIO:

DIASTOLIC ICA/CCA RATIO:

ECA:  84 cm/sec

LEFT

ICA:  74/32 cm/sec

CCA:  99/27 cm/sec

SYSTOLIC ICA/CCA RATIO:

DIASTOLIC ICA/CCA RATIO:

ECA:  78 cm/sec

RIGHT CAROTID ARTERY: No focal plaque is identified. Velocities and
waveforms are within normal limits. There is no evidence of right
carotid stenosis.

RIGHT VERTEBRAL ARTERY: Antegrade flow with normal waveform and
velocity.

LEFT CAROTID ARTERY: There is a mild amount of partially calcified
plaque at the level of the carotid bulb and extending up to the
origin of the ICA. Velocities and waveforms are normal and estimated
left ICA stenosis is less than 50%.

LEFT VERTEBRAL ARTERY: Antegrade flow with normal waveform and
velocity.
IMPRESSION: No evidence of right carotid stenosis. Minimal plaque at the left
carotid bifurcation with estimated left ICA stenosis of less than
50%.

## 2016-12-05 ENCOUNTER — Encounter: Payer: Self-pay | Admitting: Internal Medicine

## 2016-12-05 ENCOUNTER — Ambulatory Visit (INDEPENDENT_AMBULATORY_CARE_PROVIDER_SITE_OTHER): Payer: BLUE CROSS/BLUE SHIELD | Admitting: Internal Medicine

## 2016-12-05 VITALS — BP 130/80 | HR 66 | Ht 66.5 in | Wt 138.0 lb

## 2016-12-05 DIAGNOSIS — Z0000 Encounter for general adult medical examination without abnormal findings: Secondary | ICD-10-CM

## 2016-12-05 DIAGNOSIS — M1712 Unilateral primary osteoarthritis, left knee: Secondary | ICD-10-CM

## 2016-12-05 DIAGNOSIS — Z23 Encounter for immunization: Secondary | ICD-10-CM

## 2016-12-05 DIAGNOSIS — Z13220 Encounter for screening for lipoid disorders: Secondary | ICD-10-CM | POA: Diagnosis not present

## 2016-12-05 DIAGNOSIS — Z125 Encounter for screening for malignant neoplasm of prostate: Secondary | ICD-10-CM | POA: Diagnosis not present

## 2016-12-05 LAB — COMPREHENSIVE METABOLIC PANEL
ALBUMIN: 4.4 g/dL (ref 3.6–5.1)
ALT: 16 U/L (ref 9–46)
AST: 19 U/L (ref 10–35)
Alkaline Phosphatase: 57 U/L (ref 40–115)
BILIRUBIN TOTAL: 0.7 mg/dL (ref 0.2–1.2)
BUN: 18 mg/dL (ref 7–25)
CHLORIDE: 103 mmol/L (ref 98–110)
CO2: 30 mmol/L (ref 20–31)
Calcium: 9.3 mg/dL (ref 8.6–10.3)
Creat: 0.95 mg/dL (ref 0.70–1.25)
GLUCOSE: 93 mg/dL (ref 65–99)
Potassium: 4.4 mmol/L (ref 3.5–5.3)
Sodium: 139 mmol/L (ref 135–146)
Total Protein: 7.3 g/dL (ref 6.1–8.1)

## 2016-12-05 LAB — CBC WITH DIFFERENTIAL/PLATELET
Basophils Absolute: 0 cells/uL (ref 0–200)
Basophils Relative: 0 %
Eosinophils Absolute: 80 cells/uL (ref 15–500)
Eosinophils Relative: 1 %
HCT: 43.3 % (ref 38.5–50.0)
HEMOGLOBIN: 14.8 g/dL (ref 13.2–17.1)
LYMPHS PCT: 40 %
Lymphs Abs: 3200 cells/uL (ref 850–3900)
MCH: 31.4 pg (ref 27.0–33.0)
MCHC: 34.2 g/dL (ref 32.0–36.0)
MCV: 91.9 fL (ref 80.0–100.0)
MPV: 10.3 fL (ref 7.5–12.5)
Monocytes Absolute: 400 cells/uL (ref 200–950)
Monocytes Relative: 5 %
Neutro Abs: 4320 cells/uL (ref 1500–7800)
Neutrophils Relative %: 54 %
Platelets: 186 10*3/uL (ref 140–400)
RBC: 4.71 MIL/uL (ref 4.20–5.80)
RDW: 14.5 % (ref 11.0–15.0)
WBC: 8 10*3/uL (ref 3.8–10.8)

## 2016-12-05 LAB — PSA: PSA: 0.7 ng/mL (ref <=4.0)

## 2016-12-05 LAB — LIPID PANEL
Cholesterol: 167 mg/dL (ref <200)
HDL: 60 mg/dL (ref >40)
LDL Cholesterol: 96 mg/dL (ref <100)
TRIGLYCERIDES: 57 mg/dL (ref <150)
Total CHOL/HDL Ratio: 2.8 Ratio (ref <5.0)
VLDL: 11 mg/dL (ref <30)
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
## 2016-12-05 NOTE — Patient Instructions (Signed)
It was a pleasure to see you today. You may take Tylenol or Aleve or Advil for left knee pain. Flu vaccine and Prevnar 13 given today. Return in one year.

## 2016-12-07 LAB — POCT URINALYSIS DIPSTICK
Bilirubin, UA: NEGATIVE
GLUCOSE UA: NEGATIVE
Ketones, UA: NEGATIVE
Leukocytes, UA: NEGATIVE
Nitrite, UA: NEGATIVE
Protein, UA: NEGATIVE
RBC UA: NEGATIVE
Spec Grav, UA: 1.015
UROBILINOGEN UA: NEGATIVE
pH, UA: 6.5

## 2018-03-05 ENCOUNTER — Ambulatory Visit: Payer: BLUE CROSS/BLUE SHIELD | Admitting: Internal Medicine

## 2018-03-05 ENCOUNTER — Encounter: Payer: Self-pay | Admitting: Internal Medicine

## 2018-03-05 ENCOUNTER — Encounter: Payer: Self-pay | Admitting: Gastroenterology

## 2018-03-05 VITALS — BP 130/80 | HR 74 | Temp 98.3°F | Ht 68.0 in | Wt 141.0 lb

## 2018-03-05 DIAGNOSIS — R5383 Other fatigue: Secondary | ICD-10-CM | POA: Diagnosis not present

## 2018-03-05 DIAGNOSIS — R1013 Epigastric pain: Secondary | ICD-10-CM

## 2018-03-05 DIAGNOSIS — Z8679 Personal history of other diseases of the circulatory system: Secondary | ICD-10-CM

## 2018-03-05 DIAGNOSIS — Z1211 Encounter for screening for malignant neoplasm of colon: Secondary | ICD-10-CM

## 2018-03-05 DIAGNOSIS — A048 Other specified bacterial intestinal infections: Secondary | ICD-10-CM

## 2018-03-05 DIAGNOSIS — R0789 Other chest pain: Secondary | ICD-10-CM

## 2018-03-06 LAB — COMPLETE METABOLIC PANEL WITH GFR
AG Ratio: 1.4 (calc) (ref 1.0–2.5)
ALBUMIN MSPROF: 4.4 g/dL (ref 3.6–5.1)
ALT: 17 U/L (ref 9–46)
AST: 18 U/L (ref 10–35)
Alkaline phosphatase (APISO): 71 U/L (ref 40–115)
BUN: 14 mg/dL (ref 7–25)
CALCIUM: 9.3 mg/dL (ref 8.6–10.3)
CO2: 27 mmol/L (ref 20–32)
CREATININE: 0.89 mg/dL (ref 0.70–1.25)
Chloride: 104 mmol/L (ref 98–110)
GFR, EST AFRICAN AMERICAN: 101 mL/min/{1.73_m2} (ref >=60)
GFR, EST NON AFRICAN AMERICAN: 87 mL/min/{1.73_m2} (ref >=60)
GLOBULIN: 3.1 g/dL (ref 1.9–3.7)
Glucose, Bld: 95 mg/dL (ref 65–99)
Potassium: 4 mmol/L (ref 3.5–5.3)
SODIUM: 138 mmol/L (ref 135–146)
Total Bilirubin: 0.9 mg/dL (ref 0.2–1.2)
Total Protein: 7.5 g/dL (ref 6.1–8.1)

## 2018-03-06 LAB — CBC WITH DIFFERENTIAL/PLATELET
BASOS ABS: 32 {cells}/uL (ref 0–200)
Basophils Relative: 0.4 %
Eosinophils Absolute: 32 cells/uL (ref 15–500)
Eosinophils Relative: 0.4 %
HEMATOCRIT: 43.5 % (ref 38.5–50.0)
HEMOGLOBIN: 15.2 g/dL (ref 13.2–17.1)
Lymphs Abs: 2300 cells/uL (ref 850–3900)
MCH: 31.3 pg (ref 27.0–33.0)
MCHC: 34.9 g/dL (ref 32.0–36.0)
MCV: 89.5 fL (ref 80.0–100.0)
MPV: 10.4 fL (ref 7.5–12.5)
Monocytes Relative: 5.4 %
Neutro Abs: 5297 cells/uL (ref 1500–7800)
Neutrophils Relative %: 65.4 %
Platelets: 214 10*3/uL (ref 140–400)
RBC: 4.86 10*6/uL (ref 4.20–5.80)
RDW: 13.2 % (ref 11.0–15.0)
Total Lymphocyte: 28.4 %
WBC mixed population: 437 cells/uL (ref 200–950)
WBC: 8.1 10*3/uL (ref 3.8–10.8)

## 2018-03-06 LAB — TSH: TSH: 2.25 mIU/L (ref 0.40–4.50)

## 2018-03-06 LAB — LIPID PANEL
CHOL/HDL RATIO: 3.3 (calc) (ref <5.0)
Cholesterol: 150 mg/dL (ref <200)
HDL: 45 mg/dL (ref >40)
LDL Cholesterol (Calc): 91 mg/dL (calc)
NON-HDL CHOLESTEROL (CALC): 105 mg/dL (ref <130)
Triglycerides: 56 mg/dL (ref <150)

## 2018-03-06 LAB — AMYLASE: AMYLASE: 89 U/L (ref 21–101)

## 2018-03-06 LAB — LIPASE: Lipase: 7 U/L (ref 7–60)

## 2018-03-06 LAB — H. PYLORI BREATH TEST: H. PYLORI BREATH TEST: DETECTED — AB

## 2018-03-07 ENCOUNTER — Encounter: Payer: Self-pay | Admitting: Internal Medicine

## 2018-03-07 ENCOUNTER — Telehealth: Payer: Self-pay | Admitting: Internal Medicine

## 2018-03-07 DIAGNOSIS — A048 Other specified bacterial intestinal infections: Secondary | ICD-10-CM | POA: Insufficient documentation

## 2018-03-07 MED ORDER — AMOXICILLIN 500 MG PO CAPS: ORAL_CAPSULE | ORAL | 0 refills | Status: DC

## 2018-03-07 MED ORDER — CLARITHROMYCIN 500 MG PO TABS: ORAL_TABLET | ORAL | 0 refills | Status: DC

## 2018-03-07 MED ORDER — OMEPRAZOLE 40 MG PO CPDR: DELAYED_RELEASE_CAPSULE | ORAL | 0 refills | Status: DC

## 2018-03-07 NOTE — Telephone Encounter (Signed)
Seen recently for epigastric pain. Tender over xiphoid process but tested positive for H. Pylori.  Treat as follows: Biaxin 500 mg twice daily for 14 days #28  Amoxicillin 500 mg 2 p.o. twice daily for 14 days #56  Omeprazole 40 mg twice daily for 14 days #28

## 2018-03-10 NOTE — Progress Notes (Signed)
   Subjective:    Patient ID: Preston Mcdaniel, male    DOB: [birth year], 72 y.o.   MRN: [id number]  HPI 72 year old Male brought in by his daughter today saying that he has not felt well and has had some vague abdominal discomfort.  Apparently he has been working at the [employer] for a holiday and was moving a lot of things around.   Review of Systems no diarrhea nausea or vomiting     Objective:   Physical Exam He is tender over his xiphoid process and less tender in the epigastric area.  Chest is clear.  Cardiac exam regular rate and rhythm normal S1 and S2.  EKG shows right bundle branch block but no change from EKG August 2015.  He has no lower extremity edema.  He is alert and cooperative.  No JVD or carotid bruits.  No thyromegaly.  Labs drawn including CBC with differential C met fasting lipid panel TSH lipase amylase and he also had an H. pylori breath test.  All of his lab work proved to be normal except for the H. pylori breath test       Assessment & Plan:  Epigastric pain  Musculoskeletal pain-over xiphoid process probably due to lifting and moving items at the [place]  Addendum: H. pylori test is positive  Plan: Amoxicillin thousand milligrams twice daily for 14 days, Biaxin 500 mg twice daily for 14 days, omeprazole 40 mg twice daily for 14 days

## 2018-03-10 NOTE — Patient Instructions (Signed)
EKG unchanged. H. Pylori breath test pending as are labs.

## 2018-04-24 ENCOUNTER — Ambulatory Visit: Payer: BLUE CROSS/BLUE SHIELD | Admitting: Gastroenterology

## 2018-04-24 ENCOUNTER — Encounter: Payer: Self-pay | Admitting: Gastroenterology

## 2018-04-24 VITALS — BP 120/70 | HR 84 | Ht 67.0 in | Wt 140.2 lb

## 2018-04-24 DIAGNOSIS — A048 Other specified bacterial intestinal infections: Secondary | ICD-10-CM | POA: Diagnosis not present

## 2018-04-24 DIAGNOSIS — K219 Gastro-esophageal reflux disease without esophagitis: Secondary | ICD-10-CM

## 2018-04-24 DIAGNOSIS — Z1211 Encounter for screening for malignant neoplasm of colon: Secondary | ICD-10-CM | POA: Diagnosis not present

## 2018-04-24 NOTE — Patient Instructions (Addendum)
You will be set up for an upper endoscopy for GERD, abdominal pain. Please start once daily omeprazole (OTC 20mg ), best taken 20-30 min before breakfast meal. You will be set up for a colonoscopy for routine risk colon cancer screening (same time as EGD).  Patient will call office to schedule date and time for Colonoscopy and EGD  Normal BMI (Body Mass Index- based on height and weight) is between 23 and 30. Your BMI today is Body mass index is 21.96 kg/m. Marland Kitchen. Please consider follow up  regarding your BMI with your Primary Care Provider.

## 2018-04-24 NOTE — Progress Notes (Signed)
HPI: This is a very pleasant 72 year old man who was referred to me by Zola ButtonLowne Chase, Grayling CongressYvonne R, *  to evaluate GERD, routine risk for colon cancer, recent H. pylori positive breath test    He is here with a [place] interpreter.  Chief complaint is GERD, routine risk for colon cancer, recent H. pylori positive breath test  2 months ago he was having some epigastric Pains, bloating and his primary care physician checked some labs including an H. pylori breath test.  The breath test was positive and he was put on a 14-day Prevpac type of antibiotics with omeprazole twice daily.  The epigastric discomfort improved but he is still bothered by reflux and bloating.  His weight has been overall stable and he has no dysphasia or overt GI bleeding  Old Data Reviewed:  Labs 02/2018: CBC was normal, complete med about profile was normal.  H. pylori breath test was positive.     Review of systems: Pertinent positive and negative review of systems were noted in the above HPI section. All other review negative.   Past Medical History:  Diagnosis Date  . H. pylori infection   . No cardiac disease     Past Surgical History:  Procedure Laterality Date  . NO PAST SURGERIES      No current outpatient medications on file.   No current facility-administered medications for this visit.     Allergies as of 04/24/2018  . (No Known Allergies)    Family History  Family history unknown: Yes    Social History   Socioeconomic History  . Marital status: Married    Spouse name: Not on file  . Number of children: 2  . Years of education: Not on file  . Highest education level: Not on file  Occupational History    Employer: [employer]    Comment: Restaurant  Social Needs  . Financial resource strain: Not on file  . Food insecurity:    Worry: Not on file    Inability: Not on file  . Transportation needs:    Medical: Not on file    Non-medical: Not on file  Tobacco Use   . Smoking status: Never Smoker  . Smokeless tobacco: Never Used  Substance and Sexual Activity  . Alcohol use: No  . Drug use: No  . Sexual activity: Not on file  Lifestyle  . Physical activity:    Days per week: Not on file    Minutes per session: Not on file  . Stress: Not on file  Relationships  . Social connections:    Talks on phone: Not on file    Gets together: Not on file    Attends religious service: Not on file    Active member of club or organization: Not on file    Attends meetings of clubs or organizations: Not on file    Relationship status: Not on file  . Intimate partner violence:    Fear of current or ex partner: Not on file    Emotionally abused: Not on file    Physically abused: Not on file    Forced sexual activity: Not on file  Other Topics Concern  . Not on file  Social History Narrative  . Not on file     Physical Exam: Ht 5\' 7"  (1.702 m)   Wt 140 lb 3.2 oz (63.6 kg)   BMI 21.96 kg/m  Constitutional: generally well-appearing Psychiatric: alert and oriented x3 Eyes: extraocular movements intact Mouth:  oral pharynx moist, no lesions Neck: supple no lymphadenopathy Cardiovascular: heart regular rate and rhythm Lungs: clear to auscultation bilaterally Abdomen: soft, nontender, nondistended, no obvious ascites, no peritoneal signs, normal bowel sounds Extremities: no lower extremity edema bilaterally Skin: no lesions on visible extremities   Assessment and plan: 72 y.o. male with GERD, routine risk for colon cancer, recent H. pylori breath test positive  Since he is still bothered by some bloating and GERD symptoms I recommended EGD to confirm H. pylori eradication, check for significant acid damage or other gastric issues such as gastritis, peptic ulcer disease.  He has never had colon cancer screening that he is aware of and so we will also proceed with colonoscopy at the same time.  In the meantime he is going to start omeprazole  over-the-counter strength 20 mg 1 pill once daily for his reflux.    Please see the "Patient Instructions" section for addition details about the plan.   Rob Bunting, MD [facility] 04/24/2018, 9:05 AM  Cc: Zola Button, Grayling Congress, *

## 2018-06-19 ENCOUNTER — Ambulatory Visit (AMBULATORY_SURGERY_CENTER): Payer: Self-pay | Admitting: *Deleted

## 2018-06-19 VITALS — Ht 67.0 in | Wt 141.6 lb

## 2018-06-19 DIAGNOSIS — K219 Gastro-esophageal reflux disease without esophagitis: Secondary | ICD-10-CM

## 2018-06-19 DIAGNOSIS — Z1211 Encounter for screening for malignant neoplasm of colon: Secondary | ICD-10-CM

## 2018-06-19 MED ORDER — NA SULFATE-K SULFATE-MG SULF 17.5-3.13-1.6 GM/177ML PO SOLN: 1.0000 | Freq: Once | ORAL | 0 refills | Status: AC
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
## 2018-06-21 ENCOUNTER — Encounter: Payer: Self-pay | Admitting: Gastroenterology

## 2018-07-05 ENCOUNTER — Ambulatory Visit (AMBULATORY_SURGERY_CENTER): Payer: BLUE CROSS/BLUE SHIELD | Admitting: Gastroenterology

## 2018-07-05 ENCOUNTER — Encounter: Payer: Self-pay | Admitting: Gastroenterology

## 2018-07-05 VITALS — BP 115/67 | HR 87 | Temp 98.0°F | Resp 13 | Ht 67.0 in | Wt 141.0 lb

## 2018-07-05 DIAGNOSIS — Z1211 Encounter for screening for malignant neoplasm of colon: Secondary | ICD-10-CM

## 2018-07-05 DIAGNOSIS — K2970 Gastritis, unspecified, without bleeding: Secondary | ICD-10-CM

## 2018-07-05 DIAGNOSIS — R140 Abdominal distension (gaseous): Secondary | ICD-10-CM | POA: Diagnosis not present

## 2018-07-05 DIAGNOSIS — B9681 Helicobacter pylori [H. pylori] as the cause of diseases classified elsewhere: Secondary | ICD-10-CM

## 2018-07-05 DIAGNOSIS — K621 Rectal polyp: Secondary | ICD-10-CM

## 2018-07-05 DIAGNOSIS — K219 Gastro-esophageal reflux disease without esophagitis: Secondary | ICD-10-CM | POA: Diagnosis not present

## 2018-07-05 DIAGNOSIS — K2950 Unspecified chronic gastritis without bleeding: Secondary | ICD-10-CM

## 2018-07-05 DIAGNOSIS — K2990 Gastroduodenitis, unspecified, without bleeding: Secondary | ICD-10-CM | POA: Diagnosis not present

## 2018-07-05 NOTE — Op Note (Signed)
[facility] Patient Name: Preston Mcdaniel Procedure Date: 07/05/2018 1:23 PM MRN: [id number] Endoscopist: Rachael Fee , MD Age: 72 Referring MD:  Date of Birth: 1945-12-31 Gender: Male Account #: [id number] Procedure:                Colonoscopy Indications:              Screening for colorectal malignant neoplasm Medicines:                Monitored Anesthesia Care Procedure:                Pre-Anesthesia Assessment:                           - Prior to the procedure, a History and Physical                            was performed, and patient medications and                            allergies were reviewed. The patient's tolerance of                            previous anesthesia was also reviewed. The risks                            and benefits of the procedure and the sedation                            options and risks were discussed with the patient.                            All questions were answered, and informed consent                            was obtained. Prior Anticoagulants: The patient has                            taken no previous anticoagulant or antiplatelet                            agents. ASA Grade Assessment: II - A patient with                            mild systemic disease. After reviewing the risks                            and benefits, the patient was deemed in                            satisfactory condition to undergo the procedure.                           After obtaining informed consent, the colonoscope  was passed under direct vision. Throughout the                            procedure, the patient's blood pressure, pulse, and                            oxygen saturations were monitored continuously. The                            Colonoscope was introduced through the anus and                            advanced to the the cecum, identified by                            appendiceal orifice and ileocecal  valve. The                            colonoscopy was performed without difficulty. The                            patient tolerated the procedure well. The quality                            of the bowel preparation was good. The ileocecal                            valve, appendiceal orifice, and rectum were                            photographed. Scope In: 1:41:50 PM Scope Out: 1:55:02 PM Scope Withdrawal Time: 0 hours 9 minutes 30 seconds  Total Procedure Duration: 0 hours 13 minutes 12 seconds  Findings:                 A 5 mm (hyperplastic appearing) polyp was found in                            the rectum. The polyp was sessile. The polyp was                            removed with a cold snare but not retrieved.                           The exam was otherwise without abnormality on                            direct and retroflexion views. Complications:            No immediate complications. Estimated blood loss:                            None. Estimated Blood Loss:     Estimated blood loss: none. Impression:               -  One 5 mm polyp in the rectum, removed with a cold                            snare. Resected but not retrieved..                           - The examination was otherwise normal on direct                            and retroflexion views. Recommendation:           - Patient has a contact number available for                            emergencies. The signs and symptoms of potential                            delayed complications were discussed with the                            patient. Return to normal activities tomorrow.                            Written discharge instructions were provided to the                            patient.                           - Resume previous diet.                           - Continue present medications.                           - Repeat colonoscopy in 10 years for screening. Rachael Fee, MD 07/05/2018  1:58:27 PM This report has been signed electronically.

## 2018-07-05 NOTE — Progress Notes (Signed)
Pt's states no medical or surgical changes since previsit or office visit. 

## 2018-07-05 NOTE — Progress Notes (Signed)
Report given to PACU, vss 

## 2018-07-05 NOTE — Patient Instructions (Signed)
Information on gastritis given to you today      Await biopsy results on biopsies   Information on colon polyps given to you today   Await pathology results on polyps removed     YOU HAD AN ENDOSCOPIC PROCEDURE TODAY AT THE [facility]:   Refer to the procedure report that was given to you for any specific questions about what was found during the examination.  If the procedure report does not answer your questions, please call your gastroenterologist to clarify.  If you requested that your care partner not be given the details of your procedure findings, then the procedure report has been included in a sealed envelope for you to review at your convenience later.  YOU SHOULD EXPECT: Some feelings of bloating in the abdomen. Passage of more gas than usual.  Walking can help get rid of the air that was put into your GI tract during the procedure and reduce the bloating. If you had a lower endoscopy (such as a colonoscopy or flexible sigmoidoscopy) you may notice spotting of blood in your stool or on the toilet paper. If you underwent a bowel prep for your procedure, you may not have a normal bowel movement for a few days.  Please Note:  You might notice some irritation and congestion in your nose or some drainage.  This is from the oxygen used during your procedure.  There is no need for concern and it should clear up in a day or so.  SYMPTOMS TO REPORT IMMEDIATELY:   Following lower endoscopy (colonoscopy or flexible sigmoidoscopy):  Excessive amounts of blood in the stool  Significant tenderness or worsening of abdominal pains  Swelling of the abdomen that is new, acute  Fever of 100F or higher   Following upper endoscopy (EGD)  Vomiting of blood or coffee ground material  New chest pain or pain under the shoulder blades  Painful or persistently difficult swallowing  New shortness of breath  Fever of 100F or higher  Black, tarry-looking stools  For urgent or  emergent issues, a gastroenterologist can be reached at any hour by calling [phone].   DIET:  We do recommend a small meal at first, but then you may proceed to your regular diet.  Drink plenty of fluids but you should avoid alcoholic beverages for 24 hours.  ACTIVITY:  You should plan to take it easy for the rest of today and you should NOT DRIVE or use heavy machinery until tomorrow (because of the sedation medicines used during the test).    FOLLOW UP: Our staff will call the number listed on your records the next business day following your procedure to check on you and address any questions or concerns that you may have regarding the information given to you following your procedure. If we do not reach you, we will leave a message.  However, if you are feeling well and you are not experiencing any problems, there is no need to return our call.  We will assume that you have returned to your regular daily activities without incident.  If any biopsies were taken you will be contacted by phone or by letter within the next 1-3 weeks.  Please call us at [phone] if you have not heard about the biopsies in 3 weeks.    SIGNATURES/CONFIDENTIALITY: You and/or your care partner have signed paperwork which will be entered into your electronic medical record.  These signatures attest to the fact that that the information  above on your After Visit Summary has been reviewed and is understood.  Full responsibility of the confidentiality of this discharge information lies with you and/or your care-partner.  

## 2018-07-05 NOTE — Op Note (Signed)
[facility] Patient Name: Preston Mcdaniel Procedure Date: 07/05/2018 1:22 PM MRN: [id number] Endoscopist: Rachael Fee , MD Age: 72 Referring MD:  Date of Birth: 1945/12/15 Gender: Male Account #: [id number] Procedure:                Upper GI endoscopy Indications:              Heartburn, Abdominal bloating; despite H. pylori                            eradication antibiotics. Medicines:                Monitored Anesthesia Care Procedure:                Pre-Anesthesia Assessment:                           - Prior to the procedure, a History and Physical                            was performed, and patient medications and                            allergies were reviewed. The patient's tolerance of                            previous anesthesia was also reviewed. The risks                            and benefits of the procedure and the sedation                            options and risks were discussed with the patient.                            All questions were answered, and informed consent                            was obtained. Prior Anticoagulants: The patient has                            taken no previous anticoagulant or antiplatelet                            agents. ASA Grade Assessment: II - A patient with                            mild systemic disease. After reviewing the risks                            and benefits, the patient was deemed in                            satisfactory condition to undergo the procedure.  After obtaining informed consent, the endoscope was                            passed under direct vision. Throughout the                            procedure, the patient's blood pressure, pulse, and                            oxygen saturations were monitored continuously. The                            Endoscope was introduced through the mouth, and                            advanced to the second part of duodenum.  The upper                            GI endoscopy was accomplished without difficulty.                            The patient tolerated the procedure well. Scope In: Scope Out: Findings:                 Moderate inflammation characterized by erosions,                            erythema, friability and granularity was found in                            the entire examined stomach. This was most                            significant in the fundus, proximal stomach.                            Biopsies were taken with a cold forceps for                            histology. Jar 1 from antrum and body. Jar 2 from                            fundus, proximal stomach.                           The exam was otherwise without abnormality. Complications:            No immediate complications. Estimated blood loss:                            None. Estimated Blood Loss:     Estimated blood loss: none. Impression:               - Moderate gastritis                           -  The examination was otherwise normal. Recommendation:           - Patient has a contact number available for                            emergencies. The signs and symptoms of potential                            delayed complications were discussed with the                            patient. Return to normal activities tomorrow.                            Written discharge instructions were provided to the                            patient.                           - Resume previous diet.                           - Continue present medications.                           - Await pathology results. Rachael Fee, MD 07/05/2018 2:09:56 PM This report has been signed electronically.

## 2018-07-08 ENCOUNTER — Telehealth: Payer: Self-pay

## 2018-07-08 NOTE — Telephone Encounter (Signed)

## 2018-07-10 ENCOUNTER — Other Ambulatory Visit: Payer: Self-pay

## 2018-07-10 MED ORDER — BIS SUBCIT-METRONID-TETRACYC 140-125-125 MG PO CAPS: 3.0000 | ORAL_CAPSULE | Freq: Three times a day (TID) | ORAL | 0 refills | Status: DC

## 2018-07-10 MED ORDER — OMEPRAZOLE 20 MG PO CPDR: 20.0000 mg | DELAYED_RELEASE_CAPSULE | Freq: Two times a day (BID) | ORAL | 0 refills | Status: AC

## 2018-07-15 ENCOUNTER — Encounter: Payer: Self-pay | Admitting: *Deleted

## 2018-07-15 NOTE — Telephone Encounter (Deleted)
Entered in error

## 2018-07-15 NOTE — Telephone Encounter (Signed)
Entered in error

## 2018-10-14 ENCOUNTER — Ambulatory Visit (INDEPENDENT_AMBULATORY_CARE_PROVIDER_SITE_OTHER): Payer: BLUE CROSS/BLUE SHIELD | Admitting: Internal Medicine

## 2018-10-14 ENCOUNTER — Encounter: Payer: Self-pay | Admitting: Internal Medicine

## 2018-10-14 VITALS — BP 118/78 | HR 80 | Temp 98.2°F | Resp 16 | Wt 143.0 lb

## 2018-10-14 DIAGNOSIS — L03032 Cellulitis of left toe: Secondary | ICD-10-CM | POA: Diagnosis not present

## 2018-10-14 DIAGNOSIS — L089 Local infection of the skin and subcutaneous tissue, unspecified: Secondary | ICD-10-CM

## 2018-10-14 MED ORDER — DOXYCYCLINE HYCLATE 100 MG PO TABS: 100.0000 mg | ORAL_TABLET | Freq: Two times a day (BID) | ORAL | 0 refills | Status: DC

## 2018-10-14 MED ORDER — CEFTRIAXONE SODIUM 1 G IJ SOLR
1.0000 g | Freq: Once | INTRAMUSCULAR | Status: AC
  Administered 2018-10-14: 1 g via INTRAMUSCULAR

## 2018-10-14 NOTE — Progress Notes (Signed)
   Subjective:    Patient ID: Preston Mcdaniel, male    DOB: [birth year], 72 y.o.   MRN: [id number]  HPI 72 year old [place] Male in today with what appears to be paronychia is about 2 toes of his left foot.  He apparently had a bee sting on left lateral ankle recently.  Subsequently these areas of.  On his toes surrounding his nails.  There is no pus draining from the areas just redness and tenderness.  He is concerned about this.    Review of Systems see above     Objective:   Physical Exam His vital signs are stable and he is afebrile.  He has considerable redness surrounding his left second toe without drainage or pus collection.  Similar finding left fourth toe.  His ankle shows no deformity tenderness or erythema.       Assessment & Plan:  Paronychias left second and fourth toes  Plan: He is to soak foot in warm soapy water for 20 minutes twice daily.  Doxycycline 100 mg twice daily for 10 days with follow-up December 30.

## 2018-10-21 ENCOUNTER — Encounter: Payer: Self-pay | Admitting: Internal Medicine

## 2018-10-21 ENCOUNTER — Ambulatory Visit: Payer: BLUE CROSS/BLUE SHIELD | Admitting: Internal Medicine

## 2018-10-21 VITALS — BP 130/80 | HR 88 | Temp 98.1°F | Ht 67.0 in | Wt 144.0 lb

## 2018-10-21 DIAGNOSIS — L089 Local infection of the skin and subcutaneous tissue, unspecified: Secondary | ICD-10-CM

## 2018-10-21 DIAGNOSIS — M19042 Primary osteoarthritis, left hand: Secondary | ICD-10-CM | POA: Diagnosis not present

## 2018-10-21 DIAGNOSIS — M19041 Primary osteoarthritis, right hand: Secondary | ICD-10-CM

## 2018-10-21 MED ORDER — DOXYCYCLINE HYCLATE 100 MG PO TABS: 100.0000 mg | ORAL_TABLET | Freq: Two times a day (BID) | ORAL | 0 refills | Status: DC

## 2018-10-21 MED ORDER — MELOXICAM 15 MG PO TABS: ORAL_TABLET | ORAL | 2 refills | Status: DC

## 2018-10-21 NOTE — Progress Notes (Signed)
   Subjective:    Patient ID: Preston Mcdaniel, male    DOB: 30-Mar-1946, 72 y.o.   MRN: [id number]  HPI   72 year old [place] Male with infected toes for recheck. Has been taking doxycycline. Also, comp[aining of  Bilateral hand pain. 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       Review of Systems see above. Says he had bee sing left ankle before toes got red. Had fever for one day therafter     Objective:   Physical Exam Pulses intact in both feet both posterior tibial and dorsalis pedis.  Right third toe has improved considerably.  Left second toe still red about nailbed and left third toe slowly improving. Has Heberden's and Bouchard's nodes both hands but joints not red      Assessment & Plan:  Paronychias both feet-slowly improving.  Continue soaking in warm soapy water and refill doxycycline 100 mg twice daily for 10 days.  Follow-up in 7 to 10 days.  This does not look like an ischemic toe.  Osteoarthritis both hands-treat with meloxicam 15 mg daily

## 2018-10-21 NOTE — Patient Instructions (Signed)
Refill doxycycline 100 mg twice daily for 10 days and follow-up in 7 to 10 days.  Meloxicam 15 mg daily for osteoarthritis of hands.

## 2018-10-26 ENCOUNTER — Encounter: Payer: Self-pay | Admitting: Internal Medicine

## 2018-10-26 NOTE — Patient Instructions (Addendum)
Doxycycline 100 mg twice daily for 10 days.  Soak foot in warm soapy water for 20 minutes twice daily.

## 2018-10-28 ENCOUNTER — Ambulatory Visit (INDEPENDENT_AMBULATORY_CARE_PROVIDER_SITE_OTHER): Payer: BLUE CROSS/BLUE SHIELD | Admitting: Internal Medicine

## 2018-10-28 ENCOUNTER — Encounter: Payer: Self-pay | Admitting: Internal Medicine

## 2018-10-28 VITALS — BP 130/80 | HR 76 | Temp 98.1°F | Ht 67.0 in | Wt 145.0 lb

## 2018-10-28 DIAGNOSIS — L03031 Cellulitis of right toe: Secondary | ICD-10-CM

## 2018-10-28 DIAGNOSIS — L03032 Cellulitis of left toe: Secondary | ICD-10-CM | POA: Diagnosis not present

## 2018-10-28 MED ORDER — MUPIROCIN 2 % EX OINT: TOPICAL_OINTMENT | CUTANEOUS | 0 refills | Status: AC

## 2018-10-28 MED ORDER — DOXYCYCLINE HYCLATE 100 MG PO TABS: 100.0000 mg | ORAL_TABLET | Freq: Two times a day (BID) | ORAL | 0 refills | Status: AC

## 2018-10-28 NOTE — Patient Instructions (Signed)
Doxycycline has been refilled.  Use Bactroban on toes twice daily and follow-up in 2 weeks.

## 2018-10-28 NOTE — Progress Notes (Signed)
   Subjective:    Patient ID: Preston Mcdaniel, male    DOB: 05/13/1946, 72 y.o.   MRN: [id number]  HPI He was just here last week with an interpreter to follow-up on toe infections.  He is in today with his son-in-law for another appointment.  There may be a mixup in his appointments.  This 1 was originally scheduled sometime ago.  Son-in-law acts as an Equities traderinterpreter today.  Patient does not speak [place].  He was treated for bilateral toe paronychias with doxycycline.  He also apparently had an insect sting to his left posterior ankle which he thought was the cause of the problem originally.  I think maybe shoes rubbed his toes.  This was mentioned today and he said it is possible.  Review of Systems see above     Objective:   Physical Exam  He is right third toe is slowly improving.  There appears to be a resolving bullous lesion.  His left second toe looks to be improved as well.      Assessment & Plan:  Paronychia left second toe  Paronychia right third toe  Plan: These are slowly improving.  I have refilled doxycycline for an additional 10 days.  He will apply Bactroban ointment to these areas.  Follow-up in 2 weeks.

## 2018-11-12 ENCOUNTER — Ambulatory Visit: Payer: BLUE CROSS/BLUE SHIELD | Admitting: Internal Medicine

## 2018-11-14 ENCOUNTER — Encounter: Payer: Self-pay | Admitting: Internal Medicine

## 2018-11-14 ENCOUNTER — Ambulatory Visit (INDEPENDENT_AMBULATORY_CARE_PROVIDER_SITE_OTHER): Payer: BLUE CROSS/BLUE SHIELD | Admitting: Internal Medicine

## 2018-11-14 VITALS — Ht 67.0 in

## 2018-11-14 DIAGNOSIS — L03032 Cellulitis of left toe: Secondary | ICD-10-CM

## 2018-11-14 DIAGNOSIS — L03031 Cellulitis of right toe: Secondary | ICD-10-CM

## 2018-11-14 NOTE — Patient Instructions (Signed)
Infection resolved. RTC prn

## 2018-11-14 NOTE — Progress Notes (Signed)
   Subjective:    Patient ID: Preston Mcdaniel, male    DOB: Mar 19, 1946, 73 y.o.   MRN: [id number]  HPI 73 year old Male returns with interpreter today for follow-up on paronychia of right third toe and left second toe.  He is not complaining of any pain.    Review of Systems     Objective:   Physical Exam The skin around the nail bed area of these 2 toes are healing quite well with no evidence of persistent infection.       Assessment & Plan:  Paronychia right third toe-resolved  Paronychia left second toe-resolved  Plan: Return PRN.

## 2018-12-12 ENCOUNTER — Other Ambulatory Visit: Payer: Self-pay

## 2018-12-12 MED ORDER — MELOXICAM 15 MG PO TABS: ORAL_TABLET | ORAL | 2 refills | Status: DC

## 2019-03-06 ENCOUNTER — Other Ambulatory Visit: Payer: Self-pay | Admitting: Internal Medicine

## 2019-03-06 MED ORDER — MELOXICAM 15 MG PO TABS: ORAL_TABLET | ORAL | 2 refills | Status: DC

## 2019-03-06 NOTE — Telephone Encounter (Signed)
Received Fax RX request from  Pharmacy - [facility] - [address]  Medication - meloxicam (MOBIC) 15 MG tablet    Last Refill - 12/12/18  Last OV - 11/14/18  Last CPE - 12/05/16

## 2019-05-28 ENCOUNTER — Other Ambulatory Visit: Payer: Self-pay | Admitting: Internal Medicine

## 2019-08-21 ENCOUNTER — Other Ambulatory Visit: Payer: Self-pay | Admitting: Internal Medicine

## 2019-08-28 ENCOUNTER — Encounter: Payer: Self-pay | Admitting: Internal Medicine

## 2019-08-28 ENCOUNTER — Telehealth: Payer: Self-pay | Admitting: Internal Medicine

## 2019-08-28 NOTE — Telephone Encounter (Signed)
Mailed letter for patient to call and schedule CPE and Labs, past due 12/05/2017

## 2020-05-18 ENCOUNTER — Encounter: Payer: Medicaid Other | Admitting: Internal Medicine

## 2020-07-08 ENCOUNTER — Encounter: Payer: BLUE CROSS/BLUE SHIELD | Admitting: Internal Medicine

## 2020-08-19 DIAGNOSIS — M2559 Pain in other specified joint: Secondary | ICD-10-CM | POA: Diagnosis not present

## 2020-08-19 DIAGNOSIS — Z8619 Personal history of other infectious and parasitic diseases: Secondary | ICD-10-CM | POA: Diagnosis not present

## 2020-08-19 DIAGNOSIS — Z79899 Other long term (current) drug therapy: Secondary | ICD-10-CM | POA: Diagnosis not present

## 2020-08-19 DIAGNOSIS — R1013 Epigastric pain: Secondary | ICD-10-CM | POA: Diagnosis not present

## 2020-08-19 DIAGNOSIS — Z13228 Encounter for screening for other metabolic disorders: Secondary | ICD-10-CM | POA: Diagnosis not present

## 2020-08-19 DIAGNOSIS — M19042 Primary osteoarthritis, left hand: Secondary | ICD-10-CM | POA: Diagnosis not present

## 2020-08-19 DIAGNOSIS — Z13220 Encounter for screening for lipoid disorders: Secondary | ICD-10-CM | POA: Diagnosis not present

## 2020-08-19 DIAGNOSIS — M19041 Primary osteoarthritis, right hand: Secondary | ICD-10-CM | POA: Diagnosis not present

## 2020-08-19 DIAGNOSIS — Z125 Encounter for screening for malignant neoplasm of prostate: Secondary | ICD-10-CM | POA: Diagnosis not present

## 2020-08-19 DIAGNOSIS — K219 Gastro-esophageal reflux disease without esophagitis: Secondary | ICD-10-CM | POA: Diagnosis not present

## 2020-08-19 DIAGNOSIS — Z0001 Encounter for general adult medical examination with abnormal findings: Secondary | ICD-10-CM | POA: Diagnosis not present

## 2020-08-20 DIAGNOSIS — Z8619 Personal history of other infectious and parasitic diseases: Secondary | ICD-10-CM | POA: Diagnosis not present

## 2021-02-02 ENCOUNTER — Ambulatory Visit (HOSPITAL_COMMUNITY)
Admission: EM | Admit: 2021-02-02 | Discharge: 2021-02-02 | Disposition: A | Discharge status: Home / Self Care | Payer: Medicare Other

## 2021-02-02 ENCOUNTER — Other Ambulatory Visit: Payer: Self-pay

## 2021-02-02 ENCOUNTER — Encounter (HOSPITAL_COMMUNITY): Payer: Self-pay

## 2021-02-02 DIAGNOSIS — M79674 Pain in right toe(s): Secondary | ICD-10-CM

## 2021-02-02 DIAGNOSIS — M79675 Pain in left toe(s): Secondary | ICD-10-CM | POA: Diagnosis not present

## 2021-02-02 NOTE — ED Provider Notes (Signed)
[facility]   MRN: [id number] DOB: July 07, 1946  Subjective:   Preston Mcdaniel is a 75 y.o. male presenting for 2-year history of persistent intermittent pain about his toes.  Has felt it more in the past month.  Feels like the symptoms are primarily elicited in cold weather and when he is walking around a lot.  Has used Tylenol consistently with some relief.  Denies open wounds, drainage, rashes.  No chronic conditions.  He does have a PCP, last visit was in October 2021.  He had normal labs then.  He denies taking chronic medications.  No Known Allergies  Past Medical History:  Diagnosis Date  . GERD (gastroesophageal reflux disease)   . H. pylori infection   . Hypotension    per pt with near syncope 2015  . No cardiac disease      Past Surgical History:  Procedure Laterality Date  . NO PAST SURGERIES      Family History  Problem Relation Age of Onset  . Colon cancer Neg Hx   . Colon polyps Neg Hx   . Esophageal cancer Neg Hx   . Rectal cancer Neg Hx   . Stomach cancer Neg Hx     Social History   Tobacco Use  . Smoking status: Never Smoker  . Smokeless tobacco: Never Used  Substance Use Topics  . Alcohol use: No  . Drug use: No    ROS   Objective:   Vitals: BP (!) 147/78 (BP Location: Right Arm)   Pulse 84   Temp 97.7 F (36.5 C) (Oral)   Resp 20   SpO2 98%   Physical Exam Constitutional:      General: He is not in acute distress.    Appearance: Normal appearance. He is well-developed and normal weight. He is not ill-appearing, toxic-appearing or diaphoretic.  HENT:     Head: Normocephalic and atraumatic.     Right Ear: External ear normal.     Left Ear: External ear normal.     Nose: Nose normal.     Mouth/Throat:     Pharynx: Oropharynx is clear.  Eyes:     General: No scleral icterus.       Right eye: No discharge.        Left eye: No discharge.     Extraocular Movements: Extraocular movements intact.     Pupils: Pupils are  equal, round, and reactive to light.  Cardiovascular:     Rate and Rhythm: Normal rate.  Pulmonary:     Effort: Pulmonary effort is normal.  Musculoskeletal:     Cervical back: Normal range of motion.     Comments: Slight bluish discoloration of toes bilaterally with cold sensation.  No open wounds, rashes, bleeding, drainage.  Nails are intact.  Dorsalis pedis pulses 2+ bilaterally.  Neurological:     Mental Status: He is alert and oriented to person, place, and time.  Psychiatric:        Mood and Affect: Mood normal.        Behavior: Behavior normal.        Thought Content: Thought content normal.        Judgment: Judgment normal.     Assessment and Plan :   PDMP not reviewed this encounter.  1. Toe pain, bilateral     Suspect Raynaud's phenomenon.  Counseled on general management.  Physical exam findings otherwise reassuring.  Recommended follow-up with his PCP. Counseled patient on potential for adverse  effects with medications prescribed/recommended today, ER and return-to-clinic precautions discussed, patient verbalized understanding.    Wallis Bamberg, PA-C 02/02/21 1200

## 2021-02-02 NOTE — Discharge Instructions (Signed)
Do not use any nonsteroidal anti-inflammatories (NSAIDs) like ibuprofen, Motrin, naproxen, Aleve, etc. which are all available over-the-counter.  Please just use Tylenol at a dose of 500mg -650mg  once every 6 hours as needed for your aches, pains, fevers. Make sure you keep the toes warm and limit cold exposure. You can use heat sources that do not contact the skin. Follow up with his regular doctor as soon as possible.

## 2021-02-02 NOTE — ED Triage Notes (Signed)
Pt presents with pain in "tip of  the toes" x 2 years, worsens in the past month. Pain is worse when walking and cold weather. Tylenol gives relief.   Pt reports he is fasting due to his visit here today, if he needs any labs.

## 2024-03-04 ENCOUNTER — Ambulatory Visit
Admission: EM | Admit: 2024-03-04 | Discharge: 2024-03-04 | Disposition: A | Discharge status: Home / Self Care | Attending: Family Medicine | Admitting: Family Medicine

## 2024-03-04 ENCOUNTER — Ambulatory Visit (INDEPENDENT_AMBULATORY_CARE_PROVIDER_SITE_OTHER)

## 2024-03-04 DIAGNOSIS — M25562 Pain in left knee: Secondary | ICD-10-CM

## 2024-03-04 DIAGNOSIS — S8002XA Contusion of left knee, initial encounter: Secondary | ICD-10-CM | POA: Diagnosis not present

## 2024-03-04 MED ORDER — MELOXICAM 7.5 MG PO TABS: 7.5000 mg | ORAL_TABLET | Freq: Every day | ORAL | 0 refills | Status: AC
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
## 2024-03-07 ENCOUNTER — Encounter: Payer: Self-pay | Admitting: Urgent Care
